# Patient Record
Sex: Female | Born: 2000 | Race: Black or African American | Hispanic: No | Marital: Single | Smoking: Never smoker
Health system: Southern US, Community
[De-identification: ages and names within clinical notes are randomized; demographics above are authoritative.]

## PROBLEM LIST (undated history)

## (undated) ENCOUNTER — Inpatient Hospital Stay (HOSPITAL_COMMUNITY): Payer: Medicaid Other | Admitting: Obstetrics & Gynecology

## (undated) DIAGNOSIS — Z789 Other specified health status: Secondary | ICD-10-CM

## (undated) HISTORY — PX: FINGER SURGERY: SHX640

## (undated) HISTORY — PX: DILATION AND CURETTAGE OF UTERUS: SHX78

---
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
## 2019-02-13 ENCOUNTER — Other Ambulatory Visit: Payer: Self-pay

## 2019-02-13 ENCOUNTER — Encounter (HOSPITAL_BASED_OUTPATIENT_CLINIC_OR_DEPARTMENT_OTHER): Payer: Self-pay | Admitting: *Deleted

## 2019-02-13 ENCOUNTER — Emergency Department (HOSPITAL_BASED_OUTPATIENT_CLINIC_OR_DEPARTMENT_OTHER)
Admission: EM | Admit: 2019-02-13 | Discharge: 2019-02-13 | Disposition: A | Discharge status: Home / Self Care | Payer: Medicaid Other | Attending: Emergency Medicine | Admitting: Emergency Medicine

## 2019-02-13 DIAGNOSIS — J029 Acute pharyngitis, unspecified: Secondary | ICD-10-CM | POA: Diagnosis present

## 2019-02-13 DIAGNOSIS — Z79899 Other long term (current) drug therapy: Secondary | ICD-10-CM | POA: Insufficient documentation

## 2019-02-13 LAB — PREGNANCY, URINE: Preg Test, Ur: NEGATIVE

## 2019-02-13 LAB — GROUP A STREP BY PCR: Group A Strep by PCR: NOT DETECTED

## 2019-02-13 MED ORDER — DEXAMETHASONE 4 MG PO TABS
10.0000 mg | ORAL_TABLET | Freq: Once | ORAL | Status: AC
  Administered 2019-02-13: 10 mg via ORAL
  Filled 2019-02-13: qty 1

## 2019-02-13 MED ORDER — ACETAMINOPHEN 500 MG PO TABS
1000.0000 mg | ORAL_TABLET | Freq: Once | ORAL | Status: AC
  Administered 2019-02-13: 1000 mg via ORAL
  Filled 2019-02-13: qty 2

## 2019-02-13 NOTE — ED Triage Notes (Signed)
C/o sore throat light headed, lower back pain onset last pm  Denies ua sx and no frever

## 2019-02-13 NOTE — ED Provider Notes (Signed)
[facility] EMERGENCY DEPARTMENT Provider Note   CSN: [id number] Arrival date & time: 02/13/19  5361    History   Chief Complaint Chief Complaint  Patient presents with  . Sore Throat    HPI Hannah Rose is a 18 y.o. female.     The history is provided by the patient.  Sore Throat This is a new problem. The current episode started yesterday. The problem occurs constantly. The problem has not changed since onset.Pertinent negatives include no chest pain, no abdominal pain, no headaches and no shortness of breath. Associated symptoms comments: Hx of strep. Nothing aggravates the symptoms. Nothing relieves the symptoms. She has tried nothing for the symptoms. The treatment provided no relief.    History reviewed. No pertinent past medical history.  There are no active problems to display for this patient.   Past Surgical History:  Procedure Laterality Date  . DILATION AND CURETTAGE OF UTERUS    . FINGER SURGERY       OB History    Gravida  1   Para      Term      Preterm      AB      Living        SAB      TAB      Ectopic      Multiple      Live Births               Home Medications    Prior to Admission medications   Medication Sig Start Date End Date Taking? Authorizing Provider  Prenatal Vit-Fe Fumarate-FA (PRENATAL COMPLETE) 14-0.4 MG TABS Take 1 capsule by mouth daily. 12/06/16   Mackuen, Fredia Sorrow, MD    Family History No family history on file.  Social History Social History   Tobacco Use  . Smoking status: Never Smoker  . Smokeless tobacco: Never Used  Substance Use Topics  . Alcohol use: No  . Drug use: No     Allergies   Patient has no known allergies.   Review of Systems Review of Systems  Constitutional: Negative for chills and fever.  HENT: Positive for sore throat. Negative for congestion, dental problem, drooling, ear discharge, ear pain, facial swelling, hearing loss, mouth sores,  nosebleeds, postnasal drip, tinnitus, trouble swallowing and voice change.   Eyes: Negative for pain and visual disturbance.  Respiratory: Negative for cough and shortness of breath.   Cardiovascular: Negative for chest pain and palpitations.  Gastrointestinal: Negative for abdominal pain and vomiting.  Genitourinary: Negative for dysuria and hematuria.  Musculoskeletal: Negative for arthralgias and back pain.  Skin: Negative for color change and rash.  Neurological: Negative for seizures, syncope and headaches.  All other systems reviewed and are negative.    Physical Exam Updated Vital Signs BP 103/62 (BP Location: Right Arm)   Pulse 64   Temp 98.4 F (36.9 C) (Oral)   Resp 14   Ht 5\' 3"  (1.6 m)   Wt 103.5 kg   LMP 02/01/2019 (Approximate)   SpO2 100%   Breastfeeding Unknown   BMI 40.42 kg/m   Physical Exam Vitals signs and nursing note reviewed.  Constitutional:      General: She is not in acute distress.    Appearance: She is well-developed. She is not ill-appearing.  HENT:     Head: Normocephalic and atraumatic.     Right Ear: Tympanic membrane normal.     Left Ear: Tympanic membrane normal.  Mouth/Throat:     Mouth: Mucous membranes are moist. No oral lesions.     Pharynx: Uvula midline. Posterior oropharyngeal erythema present. No pharyngeal swelling, oropharyngeal exudate or uvula swelling.     Tonsils: No tonsillar exudate or tonsillar abscesses.  Eyes:     Conjunctiva/sclera: Conjunctivae normal.  Neck:     Musculoskeletal: Normal range of motion and neck supple.  Cardiovascular:     Rate and Rhythm: Normal rate and regular rhythm.     Heart sounds: No murmur.  Pulmonary:     Effort: Pulmonary effort is normal. No respiratory distress.     Breath sounds: Normal breath sounds.  Abdominal:     Palpations: Abdomen is soft.     Tenderness: There is no abdominal tenderness.  Skin:    General: Skin is warm and dry.  Neurological:     Mental Status: She  is alert.      ED Treatments / Results  Labs (all labs ordered are listed, but only abnormal results are displayed) Labs Reviewed  GROUP A STREP BY PCR  PREGNANCY, URINE    EKG None  Radiology No results found.  Procedures Procedures (including critical care time)  Medications Ordered in ED Medications  acetaminophen (TYLENOL) tablet 1,000 mg (has no administration in time range)  dexamethasone (DECADRON) tablet 10 mg (10 mg Oral Given 02/13/19 0936)     Initial Impression / Assessment and Plan / ED Course  I have reviewed the triage vital signs and the nursing notes.  Pertinent labs & imaging results that were available during my care of the patient were reviewed by me and considered in my medical decision making (see chart for details).     Hannah Rose is a 18 year old female with no significant medical history presents to the ED with sore throat.  Patient with normal vitals.  No fever.  Pregnancy test negative.  Patient with some redness in the back of her throat but no obvious exudates, swelling.  No concern for peritonsillar abscess or retropharyngeal abscess.  Patient does not have any trismus, no drooling, no signs to suggest any deep space infection.  Strep test was performed and was negative.  Will give Decadron for symptomatic relief.  Discharged from ED in good condition.  Given return precautions.  This chart was dictated using voice recognition software.  Despite best efforts to proofread,  errors can occur which can change the documentation meaning.    Final Clinical Impressions(s) / ED Diagnoses   Final diagnoses:  Sore throat    ED Discharge Orders    None       Virgina NorfolkCuratolo, Nyajah Hyson, DO 02/13/19 [id number]

## 2019-11-11 ENCOUNTER — Encounter (HOSPITAL_BASED_OUTPATIENT_CLINIC_OR_DEPARTMENT_OTHER): Payer: Self-pay | Admitting: *Deleted

## 2019-11-11 ENCOUNTER — Emergency Department (HOSPITAL_BASED_OUTPATIENT_CLINIC_OR_DEPARTMENT_OTHER): Payer: Medicaid Other

## 2019-11-11 ENCOUNTER — Other Ambulatory Visit: Payer: Self-pay

## 2019-11-11 DIAGNOSIS — Y998 Other external cause status: Secondary | ICD-10-CM | POA: Diagnosis not present

## 2019-11-11 DIAGNOSIS — Y929 Unspecified place or not applicable: Secondary | ICD-10-CM | POA: Insufficient documentation

## 2019-11-11 DIAGNOSIS — Z5321 Procedure and treatment not carried out due to patient leaving prior to being seen by health care provider: Secondary | ICD-10-CM | POA: Insufficient documentation

## 2019-11-11 DIAGNOSIS — Y9389 Activity, other specified: Secondary | ICD-10-CM | POA: Insufficient documentation

## 2019-11-11 DIAGNOSIS — M546 Pain in thoracic spine: Secondary | ICD-10-CM | POA: Diagnosis present

## 2019-11-11 LAB — URINALYSIS, ROUTINE W REFLEX MICROSCOPIC
Bilirubin Urine: NEGATIVE
Glucose, UA: NEGATIVE mg/dL
Hgb urine dipstick: NEGATIVE
Ketones, ur: NEGATIVE mg/dL
Leukocytes,Ua: NEGATIVE
Nitrite: NEGATIVE
Protein, ur: NEGATIVE mg/dL
Specific Gravity, Urine: 1.025 (ref 1.005–1.030)
pH: 6.5 (ref 5.0–8.0)

## 2019-11-11 LAB — PREGNANCY, URINE: Preg Test, Ur: NEGATIVE
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
## 2019-11-12 ENCOUNTER — Emergency Department (HOSPITAL_BASED_OUTPATIENT_CLINIC_OR_DEPARTMENT_OTHER)
Admission: EM | Admit: 2019-11-12 | Discharge: 2019-11-12 | Disposition: A | Discharge status: Home / Self Care | Payer: Medicaid Other | Attending: Emergency Medicine | Admitting: Emergency Medicine

## 2020-01-04 ENCOUNTER — Emergency Department (HOSPITAL_BASED_OUTPATIENT_CLINIC_OR_DEPARTMENT_OTHER): Payer: Medicaid Other

## 2020-01-04 ENCOUNTER — Encounter (HOSPITAL_BASED_OUTPATIENT_CLINIC_OR_DEPARTMENT_OTHER): Payer: Self-pay | Admitting: Emergency Medicine

## 2020-01-04 ENCOUNTER — Other Ambulatory Visit: Payer: Self-pay

## 2020-01-04 ENCOUNTER — Emergency Department (HOSPITAL_BASED_OUTPATIENT_CLINIC_OR_DEPARTMENT_OTHER)
Admission: EM | Admit: 2020-01-04 | Discharge: 2020-01-04 | Disposition: A | Discharge status: Home / Self Care | Payer: Medicaid Other | Attending: Emergency Medicine | Admitting: Emergency Medicine

## 2020-01-04 DIAGNOSIS — M545 Low back pain, unspecified: Secondary | ICD-10-CM

## 2020-01-04 DIAGNOSIS — M546 Pain in thoracic spine: Secondary | ICD-10-CM | POA: Insufficient documentation

## 2020-01-04 LAB — PREGNANCY, URINE: Preg Test, Ur: NEGATIVE

## 2020-01-04 MED ORDER — MELOXICAM 7.5 MG PO TABS: 7.5000 mg | ORAL_TABLET | Freq: Every day | ORAL | 0 refills | Status: AC

## 2020-01-04 MED ORDER — CYCLOBENZAPRINE HCL 10 MG PO TABS: 10.0000 mg | ORAL_TABLET | Freq: Two times a day (BID) | ORAL | 0 refills | Status: DC | PRN
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
## 2020-01-04 NOTE — ED Notes (Signed)
AVS reviewed with patient, informed of medication that were sent electronically. Opportunity for questions provided
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
## 2020-01-30 ENCOUNTER — Emergency Department (HOSPITAL_BASED_OUTPATIENT_CLINIC_OR_DEPARTMENT_OTHER)
Admission: EM | Admit: 2020-01-30 | Discharge: 2020-01-30 | Discharge status: Against Medical Advice | Payer: Medicaid Other

## 2020-01-31 ENCOUNTER — Emergency Department (HOSPITAL_BASED_OUTPATIENT_CLINIC_OR_DEPARTMENT_OTHER): Payer: Medicaid Other

## 2020-01-31 ENCOUNTER — Emergency Department (HOSPITAL_BASED_OUTPATIENT_CLINIC_OR_DEPARTMENT_OTHER)
Admission: EM | Admit: 2020-01-31 | Discharge: 2020-01-31 | Disposition: A | Discharge status: Home / Self Care | Payer: Medicaid Other | Attending: Emergency Medicine | Admitting: Emergency Medicine

## 2020-01-31 ENCOUNTER — Encounter (HOSPITAL_BASED_OUTPATIENT_CLINIC_OR_DEPARTMENT_OTHER): Payer: Self-pay | Admitting: Emergency Medicine

## 2020-01-31 ENCOUNTER — Other Ambulatory Visit: Payer: Self-pay

## 2020-01-31 DIAGNOSIS — S60112A Contusion of left thumb with damage to nail, initial encounter: Secondary | ICD-10-CM | POA: Diagnosis not present

## 2020-01-31 DIAGNOSIS — Y92810 Car as the place of occurrence of the external cause: Secondary | ICD-10-CM | POA: Insufficient documentation

## 2020-01-31 DIAGNOSIS — S6702XA Crushing injury of left thumb, initial encounter: Secondary | ICD-10-CM | POA: Diagnosis present

## 2020-01-31 DIAGNOSIS — Y999 Unspecified external cause status: Secondary | ICD-10-CM | POA: Insufficient documentation

## 2020-01-31 DIAGNOSIS — W230XXA Caught, crushed, jammed, or pinched between moving objects, initial encounter: Secondary | ICD-10-CM | POA: Diagnosis not present

## 2020-01-31 DIAGNOSIS — Z23 Encounter for immunization: Secondary | ICD-10-CM | POA: Diagnosis not present

## 2020-01-31 DIAGNOSIS — Y9389 Activity, other specified: Secondary | ICD-10-CM | POA: Insufficient documentation

## 2020-01-31 DIAGNOSIS — S6010XA Contusion of unspecified finger with damage to nail, initial encounter: Secondary | ICD-10-CM

## 2020-01-31 DIAGNOSIS — S6710XA Crushing injury of unspecified finger(s), initial encounter: Secondary | ICD-10-CM

## 2020-01-31 MED ORDER — TETANUS-DIPHTH-ACELL PERTUSSIS 5-2.5-18.5 LF-MCG/0.5 IM SUSP
0.5000 mL | Freq: Once | INTRAMUSCULAR | Status: AC
  Administered 2020-01-31: 0.5 mL via INTRAMUSCULAR
  Filled 2020-01-31: qty 0.5

## 2020-01-31 NOTE — Discharge Instructions (Signed)
Your history and exams are consistent with a crush injury to your left thumb without any underlying fracture.  As you had a growing hematoma under your nail called a subungual hematoma, we felt you would best get relief with trephination or opening the hole on the top of the nail.  This significantly relieved your discomfort and you are able to bend your finger better.  We updated your tetanus vaccination.  Please watch for signs and symptoms of infection however I suspect it will continue to heal.  You may end up losing the nail based on the injury but it may stay intact.  Please follow-up with your primary doctor.  If any symptoms change or worsen or starts to look infected over the next week or so, please return to the nearest emergency department.

## 2020-01-31 NOTE — ED Triage Notes (Signed)
Pt shut left thumb in car door yesterday. Pt has bruising to thumb and under nail

## 2020-01-31 NOTE — ED Provider Notes (Signed)
[facility] EMERGENCY DEPARTMENT Provider Note   CSN: [id number] Arrival date & time: 01/31/20  3151     History Chief Complaint  Patient presents with  . Finger Injury    Hannah Rose is a 19 y.o. female.  The history is provided by the patient and medical records. No language interpreter was used.  Hand Pain This is a new problem. The current episode started yesterday. The problem occurs constantly. The problem has been gradually worsening. Pertinent negatives include no chest pain, no abdominal pain, no headaches and no shortness of breath. The symptoms are aggravated by bending. Nothing relieves the symptoms. She has tried nothing for the symptoms. The treatment provided no relief.       History reviewed. No pertinent past medical history.  There are no problems to display for this patient.   Past Surgical History:  Procedure Laterality Date  . DILATION AND CURETTAGE OF UTERUS    . FINGER SURGERY       OB History    Gravida  1   Para      Term      Preterm      AB      Living        SAB      TAB      Ectopic      Multiple      Live Births              No family history on file.  Social History   Tobacco Use  . Smoking status: Never Smoker  . Smokeless tobacco: Never Used  Vaping Use  . Vaping Use: Never used  Substance Use Topics  . Alcohol use: No  . Drug use: No    Home Medications Prior to Admission medications   Not on File    Allergies    Patient has no known allergies.  Review of Systems   Review of Systems  Constitutional: Negative for chills, fatigue and fever.  HENT: Negative for congestion.   Respiratory: Negative for chest tightness and shortness of breath.   Cardiovascular: Negative for chest pain.  Gastrointestinal: Negative for abdominal pain.  Musculoskeletal: Negative for back pain.  Skin: Positive for color change (subungal hematoma).  Neurological: Negative for headaches.    Psychiatric/Behavioral: Negative for agitation.  All other systems reviewed and are negative.   Physical Exam Updated Vital Signs BP (!) 129/75 (BP Location: Right Arm)   Pulse 63   Temp 97.9 F (36.6 C) (Oral)   Resp 16   Ht 5\' 3"  (1.6 m)   Wt (!) 97.5 kg   SpO2 100%   BMI 38.09 kg/m   Physical Exam Vitals and nursing note reviewed.  Constitutional:      General: She is not in acute distress.    Appearance: She is not ill-appearing, toxic-appearing or diaphoretic.  HENT:     Head: Normocephalic.  Eyes:     Conjunctiva/sclera: Conjunctivae normal.  Cardiovascular:     Rate and Rhythm: Normal rate.     Pulses: Normal pulses.  Chest:     Chest wall: No tenderness.  Abdominal:     Tenderness: There is no abdominal tenderness.  Musculoskeletal:        General: Tenderness and signs of injury present.     Left hand: Tenderness present. Normal capillary refill. Normal pulse.       Hands:  Skin:    Capillary Refill: Capillary refill takes less than 2 seconds.  Findings: Bruising present.  Neurological:     Mental Status: She is alert and oriented to person, place, and time.     Sensory: No sensory deficit.     Motor: No weakness.  Psychiatric:        Mood and Affect: Mood normal.     ED Results / Procedures / Treatments   Labs (all labs ordered are listed, but only abnormal results are displayed) Labs Reviewed - No data to display  EKG None  Radiology DG Finger Thumb Left  Result Date: 01/31/2020 CLINICAL DATA:  Left thumb injury. EXAM: LEFT THUMB 2+V COMPARISON:  No recent prior. FINDINGS: No acute bony or joint abnormality identified. No evidence of fracture dislocation. No radiopaque foreign body. IMPRESSION: No acute bony abnormality.  No radiopaque foreign body. Electronically Signed   By: Maisie Fus  Register   On: 01/31/2020 07:16    Procedures .Nail Removal  Date/Time: 01/31/2020 9:40 AM Performed by: Heide Scales, MD Authorized by:  Heide Scales, MD   Consent:    Consent obtained:  Verbal   Consent given by:  Patient   Risks discussed:  Bleeding, pain, infection and permanent nail deformity   Alternatives discussed:  No treatment Location:    Hand:  L thumb Pre-procedure details:    Skin preparation:  ChloraPrep   Preparation: Patient was prepped and draped in the usual sterile fashion   Anesthesia (see MAR for exact dosages):    Anesthesia method:  None Trephination:    Subungual hematoma drained: yes     Trephination instrument:  Cautery and needle Post-procedure details:    Dressing:  4x4 sterile gauze   Patient tolerance of procedure:  Tolerated well, no immediate complications   (including critical care time)  Medications Ordered in ED Medications  Tdap (BOOSTRIX) injection 0.5 mL (has no administration in time range)    ED Course  I have reviewed the triage vital signs and the nursing notes.  Pertinent labs & imaging results that were available during my care of the patient were reviewed by me and considered in my medical decision making (see chart for details).    MDM Rules/Calculators/A&P                          Hannah Rose is a 19 y.o. female with no significant past medical history who is right-handed who presents with left hand injury.  She reports that yesterday afternoon around 3:30 PM, she crushed her left thumb in a car door while closing it.  She reports immediate onset of pain.  She denies any laceration.  She reports that overnight, she developed a bruising and hematoma under the nail of her left thumb and wanted to have it evaluated as the pain was worsening.  She denies numbness, tingling, or weakness.  She denies other injuries.  She denies fevers, chills congestion, or cough.  She does not think her tetanus is up-to-date.  She denies any other pains including no wrist pain, shoulder pain, or elbow pain.  On exam, patient does have a partial subungual hematoma on the  left thumb.  She does have good cap refill, sensation, and pulse.  She had pain with bending of the DIP joint on the thumb.  She had no snuffbox tenderness.  Good grip strength.  Good pulses.  No other injuries on exam.  Lungs clear and chest nontender.  Abdomen nontender.  Had a shared decision-making conversation with patient and we agreed  she tried nail trephination.  X-ray was obtained and there was no evidence of fracture or dislocation.  Trephination took place without difficulty and the hematoma was drained.  Patient had significant pain relief and was able to bend her thumb normally.  She is feeling better.  Tetanus will be updated.  Patient will follow up with her PCP and we discussed management of the nail going forward as she may still lose the nail.  Patient agrees to watch for signs and symptoms of infection developing.  Do not feel she is high risk for infection at this time with lack of other medical problems.  Patient agrees with plan of care and follow-up instructions.  Patient discharged in good condition after significant relief after needle trephination.    Final Clinical Impression(s) / ED Diagnoses Final diagnoses:  Subungual hematoma of digit of hand, initial encounter  Crushing injury of finger, initial encounter    Rx / DC Orders ED Discharge Orders    None     Clinical Impression: 1. Subungual hematoma of digit of hand, initial encounter   2. Crushing injury of finger, initial encounter     Disposition: Discharge  Condition: Good  I have discussed the results, Dx and Tx plan with the pt(& family if present). He/she/they expressed understanding and agree(s) with the plan. Discharge instructions discussed at great length. Strict return precautions discussed and pt &/or family have verbalized understanding of the instructions. No further questions at time of discharge.    New Prescriptions   No medications on file    Follow Up: Alm Bustard, MD [address] [place] [address] [phone]     [facility]MENT [address] [id number] [facility] [place] [address] [phone]       Franz Svec, Canary Brim, MD 01/31/20 [phone]

## 2020-05-31 ENCOUNTER — Other Ambulatory Visit: Payer: Self-pay

## 2020-05-31 ENCOUNTER — Emergency Department (HOSPITAL_BASED_OUTPATIENT_CLINIC_OR_DEPARTMENT_OTHER)
Admission: EM | Admit: 2020-05-31 | Discharge: 2020-05-31 | Disposition: A | Discharge status: Home / Self Care | Payer: Medicaid Other | Attending: Emergency Medicine | Admitting: Emergency Medicine

## 2020-05-31 ENCOUNTER — Encounter (HOSPITAL_BASED_OUTPATIENT_CLINIC_OR_DEPARTMENT_OTHER): Payer: Self-pay | Admitting: Emergency Medicine

## 2020-05-31 DIAGNOSIS — M26622 Arthralgia of left temporomandibular joint: Secondary | ICD-10-CM | POA: Insufficient documentation

## 2020-05-31 DIAGNOSIS — O99891 Other specified diseases and conditions complicating pregnancy: Secondary | ICD-10-CM | POA: Insufficient documentation

## 2020-05-31 DIAGNOSIS — Z3A09 9 weeks gestation of pregnancy: Secondary | ICD-10-CM | POA: Insufficient documentation

## 2020-05-31 DIAGNOSIS — H6692 Otitis media, unspecified, left ear: Secondary | ICD-10-CM | POA: Diagnosis not present

## 2020-05-31 DIAGNOSIS — O219 Vomiting of pregnancy, unspecified: Secondary | ICD-10-CM | POA: Diagnosis not present

## 2020-05-31 DIAGNOSIS — H6690 Otitis media, unspecified, unspecified ear: Secondary | ICD-10-CM

## 2020-05-31 MED ORDER — AMOXICILLIN 500 MG PO CAPS
500.0000 mg | ORAL_CAPSULE | Freq: Three times a day (TID) | ORAL | 0 refills | Status: AC
Start: 2020-05-31 — End: 2020-06-07

## 2020-05-31 MED ORDER — DOXYLAMINE-PYRIDOXINE 10-10 MG PO TBEC: 1.0000 | DELAYED_RELEASE_TABLET | Freq: Two times a day (BID) | ORAL | 0 refills | Status: AC | PRN

## 2020-05-31 NOTE — ED Provider Notes (Signed)
[facility] Emergency Department Provider Note MRN:  [id number]  Arrival date & time: 05/31/20     Chief Complaint   Otalgia   History of Present Illness   Hannah Rose is a 19 y.o. year-old female with no pertinent past medical history presenting to the ED with chief complaint of otalgia.  3 separate complaints this evening.  Left ear pain for the past 3 days.  Also with left-sided jaw pain worse when trying to eat.  Also with fairly frequent nausea and vomiting over the past several weeks.  Patient is about [redacted] weeks pregnant.  She denies fever, no headache, no vision change, no neck pain, no chest pain or shortness of breath, no abdominal pain, no vaginal bleeding or discharge.  Symptoms are intermittent, mild to moderate in severity.  Review of Systems  A complete 10 system review of systems was obtained and all systems are negative except as noted in the HPI and PMH.   Patient's Health History   History reviewed. No pertinent past medical history.  Past Surgical History:  Procedure Laterality Date  . DILATION AND CURETTAGE OF UTERUS    . FINGER SURGERY      History reviewed. No pertinent family history.  Social History   Socioeconomic History  . Marital status: Single    Spouse name: Not on file  . Number of children: Not on file  . Years of education: Not on file  . Highest education level: Not on file  Occupational History  . Not on file  Tobacco Use  . Smoking status: Never Smoker  . Smokeless tobacco: Never Used  Vaping Use  . Vaping Use: Never used  Substance and Sexual Activity  . Alcohol use: No  . Drug use: No  . Sexual activity: Yes    Birth control/protection: None  Other Topics Concern  . Not on file  Social History Narrative  . Not on file   Social Determinants of Health   Financial Resource Strain:   . Difficulty of Paying Living Expenses: Not on file  Food Insecurity:   . Worried About Programme researcher, broadcasting/film/video in  the Last Year: Not on file  . Ran Out of Food in the Last Year: Not on file  Transportation Needs:   . Lack of Transportation (Medical): Not on file  . Lack of Transportation (Non-Medical): Not on file  Physical Activity:   . Days of Exercise per Week: Not on file  . Minutes of Exercise per Session: Not on file  Stress:   . Feeling of Stress : Not on file  Social Connections:   . Frequency of Communication with Friends and Family: Not on file  . Frequency of Social Gatherings with Friends and Family: Not on file  . Attends Religious Services: Not on file  . Active Member of Clubs or Organizations: Not on file  . Attends Banker Meetings: Not on file  . Marital Status: Not on file  Intimate Partner Violence:   . Fear of Current or Ex-Partner: Not on file  . Emotionally Abused: Not on file  . Physically Abused: Not on file  . Sexually Abused: Not on file     Physical Exam   Vitals:   05/31/20 0021  BP: 131/85  Pulse: (!) 119  Resp: 20  Temp: 98.6 F (37 C)  SpO2: 100%    CONSTITUTIONAL: Well-appearing, NAD NEURO:  Alert and oriented x 3, no focal deficits EYES:  eyes equal and  reactive; erythematous left TM with effusion ENT/NECK:  no LAD, no JVD CARDIO: Regular rate, well-perfused, normal S1 and S2 PULM:  CTAB no wheezing or rhonchi GI/GU:  normal bowel sounds, non-distended, non-tender MSK/SPINE:  No gross deformities, no edema SKIN:  no rash, atraumatic PSYCH:  Appropriate speech and behavior  *Additional and/or pertinent findings included in MDM below  Diagnostic and Interventional Summary    EKG Interpretation  Date/Time:    Ventricular Rate:    PR Interval:    QRS Duration:   QT Interval:    QTC Calculation:   R Axis:     Text Interpretation:        Labs Reviewed - No data to display  No orders to display    Medications - No data to display   Procedures  /  Critical Care Procedures  ED Course and Medical Decision Making  I have  reviewed the triage vital signs, the nursing notes, and pertinent available records from the EMR.  Listed above are laboratory and imaging tests that I personally ordered, reviewed, and interpreted and then considered in my medical decision making (see below for details).  Patient's left TM appears infected, will treat for otitis media.  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  Her nausea vomiting seems consistent with first trimester pregnancy, will prescribe Diclegis.  Nontender abdomen, otherwise reassuring vital signs.  Was initially tachycardic in triage up to 119 but on my reassessment her heart rate is in the 80s.       Elmer Sow. Pilar Plate, MD [facility] Emergency Medicine [facility] [email or web address]  Final Clinical Impressions(s) / ED Diagnoses     ICD-10-CM   1. Acute otitis media, unspecified otitis media type  H66.90   2. Arthralgia of left temporomandibular joint  M26.622   3. Nausea and vomiting during pregnancy prior to [redacted] weeks gestation  O21.9     ED Discharge Orders         Ordered    amoxicillin (AMOXIL) 500 MG capsule  3 times daily        05/31/20 0131    Doxylamine-Pyridoxine 10-10 MG TBEC  2 times daily PRN        05/31/20 0131           Discharge Instructions Discussed with and Provided to Patient:     Discharge Instructions     You were evaluated in the Emergency Department and after careful evaluation, we did not find any emergent condition requiring admission or further testing in the hospital.  Your exam/testing today is overall reassuring.  Symptoms seem to be due to an ear infection.  Please take the amoxicillin antibiotic as directed.  You can use the Diclegis antinausea medication as needed.  We recommend Tylenol 1000 mg every 4-6 hours for your discomforts.  Please return to the Emergency Department if you experience any worsening of your condition.   Thank you for allowing [place] to be a part of  your care.      Sabas Sous, MD 05/31/20 [phone]

## 2020-05-31 NOTE — Discharge Instructions (Addendum)
You were evaluated in the Emergency Department and after careful evaluation, we did not find any emergent condition requiring admission or further testing in the hospital.  Your exam/testing today is overall reassuring.  Symptoms seem to be due to an ear infection.  Please take the amoxicillin antibiotic as directed.  You can use the Diclegis antinausea medication as needed.  We recommend Tylenol 1000 mg every 4-6 hours for your discomforts.  Please return to the Emergency Department if you experience any worsening of your condition.   Thank you for allowing [place] to be a part of your care.

## 2020-05-31 NOTE — ED Triage Notes (Signed)
Left ear pain and swelling onset yesterday. States hx of ear infections in same ear. Also reports upper back pain, [redacted] weeks pregnant

## 2020-06-17 ENCOUNTER — Emergency Department (HOSPITAL_BASED_OUTPATIENT_CLINIC_OR_DEPARTMENT_OTHER)
Admission: EM | Admit: 2020-06-17 | Discharge: 2020-06-17 | Disposition: A | Discharge status: Home / Self Care | Payer: Medicaid Other | Attending: Emergency Medicine | Admitting: Emergency Medicine

## 2020-06-17 ENCOUNTER — Encounter (HOSPITAL_BASED_OUTPATIENT_CLINIC_OR_DEPARTMENT_OTHER): Payer: Self-pay

## 2020-06-17 ENCOUNTER — Other Ambulatory Visit: Payer: Self-pay

## 2020-06-17 DIAGNOSIS — Z3A12 12 weeks gestation of pregnancy: Secondary | ICD-10-CM | POA: Diagnosis not present

## 2020-06-17 DIAGNOSIS — R438 Other disturbances of smell and taste: Secondary | ICD-10-CM | POA: Diagnosis present

## 2020-06-17 DIAGNOSIS — O98511 Other viral diseases complicating pregnancy, first trimester: Secondary | ICD-10-CM | POA: Insufficient documentation

## 2020-06-17 DIAGNOSIS — U071 COVID-19: Secondary | ICD-10-CM | POA: Insufficient documentation

## 2020-06-17 DIAGNOSIS — J069 Acute upper respiratory infection, unspecified: Secondary | ICD-10-CM

## 2020-06-17 LAB — RESP PANEL BY RT-PCR (FLU A&B, COVID) ARPGX2
Influenza A by PCR: NEGATIVE
Influenza B by PCR: NEGATIVE
SARS Coronavirus 2 by RT PCR: POSITIVE — AB

## 2020-06-17 NOTE — ED Triage Notes (Signed)
Pt c/o flu like sx x 1 week-loss of taste/smell yesterday-NAD-steady gait

## 2020-06-17 NOTE — ED Notes (Signed)
Patient did not notify staff prior to leaving. Was seen by provider

## 2020-06-17 NOTE — ED Provider Notes (Signed)
[facility] EMERGENCY DEPARTMENT Provider Note   CSN: [id number] Arrival date & time: 06/17/20  1249     History Chief Complaint  Patient presents with  . Cough    Hannah Rose is a 19 y.o. female.  Patient [redacted] weeks pregnant.  No abdominal pain, no vaginal bleeding or discharge.  Covid type symptoms with loss of taste and smell, cough but no respiratory problems otherwise.  The history is provided by the patient.  Cough Cough characteristics:  Non-productive Sputum characteristics:  Nondescript Severity:  Mild Onset quality:  Gradual Timing:  Intermittent Progression:  Waxing and waning Chronicity:  New Context: upper respiratory infection   Relieved by:  Nothing Worsened by:  Nothing Associated symptoms: no chest pain, no chills, no ear pain, no fever, no rash, no shortness of breath and no sore throat        History reviewed. No pertinent past medical history.  There are no problems to display for this patient.   Past Surgical History:  Procedure Laterality Date  . DILATION AND CURETTAGE OF UTERUS    . FINGER SURGERY       OB History    Gravida  2   Para      Term      Preterm      AB      Living        SAB      IAB      Ectopic      Multiple      Live Births              No family history on file.  Social History   Tobacco Use  . Smoking status: Never Smoker  . Smokeless tobacco: Never Used  Vaping Use  . Vaping Use: Never used  Substance Use Topics  . Alcohol use: No  . Drug use: No    Home Medications Prior to Admission medications   Medication Sig Start Date End Date Taking? Authorizing Provider  Doxylamine-Pyridoxine 10-10 MG TBEC Take 1 tablet by mouth 2 (two) times daily as needed. 05/31/20   Sabas Sous, MD    Allergies    Patient has no known allergies.  Review of Systems   Review of Systems  Constitutional: Negative for chills and fever.  HENT: Negative for ear pain and sore throat.    Eyes: Negative for pain and visual disturbance.  Respiratory: Positive for cough. Negative for shortness of breath.   Cardiovascular: Negative for chest pain and palpitations.  Gastrointestinal: Negative for abdominal pain and vomiting.  Genitourinary: Negative for dysuria and hematuria.  Musculoskeletal: Negative for arthralgias and back pain.  Skin: Negative for color change and rash.  Neurological: Negative for seizures and syncope.  All other systems reviewed and are negative.   Physical Exam Updated Vital Signs BP 123/78 (BP Location: Left Arm)   Pulse 100   Temp 98.8 F (37.1 C) (Oral)   Resp 20   LMP 12/27/2019 (Approximate)   SpO2 96%   Physical Exam Vitals and nursing note reviewed.  Constitutional:      General: She is not in acute distress.    Appearance: She is well-developed and well-nourished.  HENT:     Head: Normocephalic and atraumatic.     Nose: Congestion present.  Eyes:     Extraocular Movements: Extraocular movements intact.     Conjunctiva/sclera: Conjunctivae normal.     Pupils: Pupils are equal, round, and reactive to light.  Cardiovascular:  Rate and Rhythm: Normal rate.  Pulmonary:     Effort: Pulmonary effort is normal. No respiratory distress.  Musculoskeletal:        General: No edema.     Cervical back: Neck supple.  Neurological:     Mental Status: She is alert.  Psychiatric:        Mood and Affect: Mood and affect normal.     ED Results / Procedures / Treatments   Labs (all labs ordered are listed, but only abnormal results are displayed) Labs Reviewed  RESP PANEL BY RT-PCR (FLU A&B, COVID) ARPGX2 - Abnormal; Notable for the following components:      Result Value   SARS Coronavirus 2 by RT PCR POSITIVE (*)    All other components within normal limits    EKG None  Radiology No results found.  Procedures Procedures (including critical care time)  Medications Ordered in ED Medications - No data to display  ED  Course  I have reviewed the triage vital signs and the nursing notes.  Pertinent labs & imaging results that were available during my care of the patient were reviewed by me and considered in my medical decision making (see chart for details).    MDM Rules/Calculators/A&P                          Hannah Rose is a 19 year old female who presents to the ED with Covid type symptoms.  Loss of taste and smell.  Not vaccinated.  Patient [redacted] weeks pregnant but no abdominal pain, vaginal discharge, vaginal bleeding.  Overall URI symptoms similar to her child.  Could be Covid versus other viral process.  Swab for Covid and influenza.  Given return precautions but otherwise appeared well with no signs of respiratory distress.  Normal vitals.  No fever.  This chart was dictated using voice recognition software.  Despite best efforts to proofread,  errors can occur which can change the documentation meaning.   Hannah Rose was evaluated in Emergency Department on 06/17/2020 for the symptoms described in the history of present illness. She was evaluated in the context of the global COVID-19 pandemic, which necessitated consideration that the patient might be at risk for infection with the SARS-CoV-2 virus that causes COVID-19. Institutional protocols and algorithms that pertain to the evaluation of patients at risk for COVID-19 are in a state of rapid change based on information released by regulatory bodies including the CDC and federal and state organizations. These policies and algorithms were followed during the patient's care in the ED.    Final Clinical Impression(s) / ED Diagnoses Final diagnoses:  Upper respiratory tract infection, unspecified type    Rx / DC Orders ED Discharge Orders    None       Virgina Norfolk, DO 06/17/20 1422

## 2020-06-18 ENCOUNTER — Telehealth: Payer: Self-pay | Admitting: Nurse Practitioner

## 2020-06-18 DIAGNOSIS — U071 COVID-19: Secondary | ICD-10-CM

## 2020-06-18 NOTE — Telephone Encounter (Signed)
Called to Discuss with patient about Covid symptoms and the use of a monoclonal antibody infusion for those with mild to moderate Covid symptoms and at a high risk of hospitalization.     Pt is qualified for this infusion at the [place] infusion center due to co-morbid conditions and/or a member of an at-risk group.     Unable to reach pt. Left message to return call.   Quatisha Zylka, DNP, AGNP-C [phone] (Infusion Center Hotline)  

## 2020-07-18 ENCOUNTER — Emergency Department (HOSPITAL_BASED_OUTPATIENT_CLINIC_OR_DEPARTMENT_OTHER)
Admission: EM | Admit: 2020-07-18 | Discharge: 2020-07-19 | Disposition: A | Discharge status: Home / Self Care | Payer: Medicaid Other | Attending: Emergency Medicine | Admitting: Emergency Medicine

## 2020-07-18 ENCOUNTER — Encounter (HOSPITAL_BASED_OUTPATIENT_CLINIC_OR_DEPARTMENT_OTHER): Payer: Self-pay | Admitting: Emergency Medicine

## 2020-07-18 ENCOUNTER — Other Ambulatory Visit: Payer: Self-pay

## 2020-07-18 DIAGNOSIS — K226 Gastro-esophageal laceration-hemorrhage syndrome: Secondary | ICD-10-CM | POA: Diagnosis not present

## 2020-07-18 DIAGNOSIS — Z3A17 17 weeks gestation of pregnancy: Secondary | ICD-10-CM | POA: Insufficient documentation

## 2020-07-18 DIAGNOSIS — O219 Vomiting of pregnancy, unspecified: Secondary | ICD-10-CM | POA: Diagnosis not present

## 2020-07-18 DIAGNOSIS — O99612 Diseases of the digestive system complicating pregnancy, second trimester: Secondary | ICD-10-CM | POA: Diagnosis not present

## 2020-07-18 LAB — URINALYSIS, MICROSCOPIC (REFLEX): RBC / HPF: NONE SEEN RBC/hpf (ref 0–5)

## 2020-07-18 LAB — COMPREHENSIVE METABOLIC PANEL
ALT: 21 U/L (ref 0–44)
AST: 23 U/L (ref 15–41)
Albumin: 3.7 g/dL (ref 3.5–5.0)
Alkaline Phosphatase: 60 U/L (ref 38–126)
Anion gap: 11 (ref 5–15)
BUN: 7 mg/dL (ref 6–20)
CO2: 24 mmol/L (ref 22–32)
Calcium: 9.3 mg/dL (ref 8.9–10.3)
Chloride: 100 mmol/L (ref 98–111)
Creatinine, Ser: 0.72 mg/dL (ref 0.44–1.00)
GFR, Estimated: >60 mL/min (ref >60)
Glucose, Bld: 99 mg/dL (ref 70–99)
Potassium: 3.6 mmol/L (ref 3.5–5.1)
Sodium: 135 mmol/L (ref 135–145)
Total Bilirubin: 0.6 mg/dL (ref 0.3–1.2)
Total Protein: 6.9 g/dL (ref 6.5–8.1)

## 2020-07-18 LAB — CBC
HCT: 38.2 % (ref 36.0–46.0)
Hemoglobin: 13.5 g/dL (ref 12.0–15.0)
MCH: 29.6 pg (ref 26.0–34.0)
MCHC: 35.3 g/dL (ref 30.0–36.0)
MCV: 83.8 fL (ref 80.0–100.0)
Platelets: 295 10*3/uL (ref 150–400)
RBC: 4.56 MIL/uL (ref 3.87–5.11)
RDW: 12.5 % (ref 11.5–15.5)
WBC: 7.8 10*3/uL (ref 4.0–10.5)
nRBC: 0 % (ref 0.0–0.2)

## 2020-07-18 LAB — URINALYSIS, ROUTINE W REFLEX MICROSCOPIC
Bilirubin Urine: NEGATIVE
Glucose, UA: NEGATIVE mg/dL
Hgb urine dipstick: NEGATIVE
Ketones, ur: 40 mg/dL — AB
Nitrite: NEGATIVE
Protein, ur: NEGATIVE mg/dL
Specific Gravity, Urine: 1.02 (ref 1.005–1.030)
pH: 6 (ref 5.0–8.0)

## 2020-07-18 LAB — TROPONIN I (HIGH SENSITIVITY): Troponin I (High Sensitivity): <2 ng/L (ref <18)

## 2020-07-18 LAB — LIPASE, BLOOD: Lipase: 27 U/L (ref 11–51)

## 2020-07-18 MED ORDER — FAMOTIDINE IN NACL 20-0.9 MG/50ML-% IV SOLN
20.0000 mg | Freq: Once | INTRAVENOUS | Status: AC
  Administered 2020-07-18: 20 mg via INTRAVENOUS
  Filled 2020-07-18: qty 50

## 2020-07-18 MED ORDER — ONDANSETRON HCL 4 MG/2ML IJ SOLN
4.0000 mg | Freq: Once | INTRAMUSCULAR | Status: AC | PRN
  Administered 2020-07-18: 4 mg via INTRAVENOUS
  Filled 2020-07-18: qty 2

## 2020-07-18 MED ORDER — LACTATED RINGERS IV BOLUS
1000.0000 mL | Freq: Once | INTRAVENOUS | Status: AC
  Administered 2020-07-18: 1000 mL via INTRAVENOUS

## 2020-07-18 NOTE — ED Triage Notes (Addendum)
Reports having n/v all day today.  Also c/o epigastric pain since she started vomiting.  Reports bright red blood in vomit this afternoon.  Reports she passed out X 1.  Per mother was out 2 minutes.  Patient is [redacted] Weeks pregnant.

## 2020-07-18 NOTE — ED Notes (Signed)
Tech reported to RN that pt c/o chest pain currently. PA notified,  V/O obtained from PA for EKG.

## 2020-07-18 NOTE — ED Provider Notes (Signed)
[facility] Provider Note: Hannah Dell, MD, FACEP  CSN: [id number] MRN: [id number] ARRIVAL: 07/18/20 at 2109 ROOM: MH09/MH09   CHIEF COMPLAINT  Vomiting and Chest Pain   HISTORY OF PRESENT ILLNESS  07/18/20 11:02 PM Hannah Rose is a 20 y.o. female who is about [redacted] weeks pregnant.  She has had nausea and vomiting since yesterday evening.  The vomiting has been severe at times.  As the vomiting progressed it became blood-streaked.  She is also now having lower substernal chest pain which she rates as an 8 out of 10.  She describes it as burning, stabbing and sharp.  It is worse with vomiting.  Earlier, attempting to eat or drink caused her to vomit.  This vomiting is more severe than her usual morning sickness.  She has had no abdominal pain or cramping with this and has had no diarrhea.  She has not had a fever.  She was given Zofran IV prior to my evaluation with relief of her nausea.   History reviewed. No pertinent past medical history.  Past Surgical History:  Procedure Laterality Date  . DILATION AND CURETTAGE OF UTERUS    . FINGER SURGERY      No family history on file.  Social History   Tobacco Use  . Smoking status: Never Smoker  . Smokeless tobacco: Never Used  Vaping Use  . Vaping Use: Never used  Substance Use Topics  . Alcohol use: No  . Drug use: No    Prior to Admission medications   Medication Sig Start Date End Date Taking? Authorizing Provider  Doxylamine-Pyridoxine 10-10 MG TBEC Take 1 tablet by mouth 2 (two) times daily as needed. 05/31/20  Yes Sabas Sous, MD  famotidine (PEPCID) 40 MG tablet Take 1 tablet (40 mg total) by mouth 2 (two) times daily. 07/19/20  Yes Ruhee Enck, MD  metoCLOPramide (REGLAN) 10 MG tablet Take 1 tablet (10 mg total) by mouth every 6 (six) hours as needed for nausea or vomiting (nausea/headache). 07/19/20  Yes Avenell Sellers, MD    Allergies Patient has no known allergies.   REVIEW OF SYSTEMS   Negative except as noted here or in the History of Present Illness.   PHYSICAL EXAMINATION  Initial Vital Signs Blood pressure 117/68, pulse 64, temperature 98.5 F (36.9 C), temperature source Oral, resp. rate 12, height 5\' 3"  (1.6 m), weight 106.6 kg, last menstrual period 12/27/2019, SpO2 99 %, unknown if currently breastfeeding.  Examination General: Well-developed, well-nourished female in no acute distress; appearance consistent with age of record HENT: normocephalic; atraumatic Eyes: pupils equal, round and reactive to light; extraocular muscles intact Neck: supple Heart: regular rate and rhythm Lungs: clear to auscultation bilaterally Chest: Nontender Abdomen: soft; nondistended; nontender; bowel sounds present Extremities: No deformity; full range of motion; pulses normal Neurologic: Awake, alert and oriented; motor function intact in all extremities and symmetric; no facial droop Skin: Warm and dry Psychiatric: Normal mood and affect   RESULTS  Summary of this visit's results, reviewed and interpreted by myself:   EKG Interpretation  Date/Time:  Saturday July 18 2020 22:25:04 EST Ventricular Rate:  69 PR Interval:  132 QRS Duration: 84 QT Interval:  380 QTC Calculation: 408 R Axis:   77 Text Interpretation: Sinus rhythm Atrial premature complex Rate is slower Confirmed by Symiah Nowotny (05-30-2006) on 07/18/2020 11:02:24 PM      Laboratory Studies: Results for orders placed or performed during the hospital encounter of 07/18/20 (from the past 24  hour(s))  Lipase, blood     Status: None   Collection Time: 07/18/20  9:27 PM  Result Value Ref Range   Lipase 27 11 - 51 U/L  Comprehensive metabolic panel     Status: None   Collection Time: 07/18/20  9:27 PM  Result Value Ref Range   Sodium 135 135 - 145 mmol/L   Potassium 3.6 3.5 - 5.1 mmol/L   Chloride 100 98 - 111 mmol/L   CO2 24 22 - 32 mmol/L   Glucose, Bld 99 70 - 99 mg/dL   BUN 7 6 - 20 mg/dL    Creatinine, Ser 4.85 0.44 - 1.00 mg/dL   Calcium 9.3 8.9 - 46.2 mg/dL   Total Protein 6.9 6.5 - 8.1 g/dL   Albumin 3.7 3.5 - 5.0 g/dL   AST 23 15 - 41 U/L   ALT 21 0 - 44 U/L   Alkaline Phosphatase 60 38 - 126 U/L   Total Bilirubin 0.6 0.3 - 1.2 mg/dL   GFR, Estimated >70 >35 mL/min   Anion gap 11 5 - 15  CBC     Status: None   Collection Time: 07/18/20  9:27 PM  Result Value Ref Range   WBC 7.8 4.0 - 10.5 K/uL   RBC 4.56 3.87 - 5.11 MIL/uL   Hemoglobin 13.5 12.0 - 15.0 g/dL   HCT 00.9 38.1 - 82.9 %   MCV 83.8 80.0 - 100.0 fL   MCH 29.6 26.0 - 34.0 pg   MCHC 35.3 30.0 - 36.0 g/dL   RDW 93.7 16.9 - 67.8 %   Platelets 295 150 - 400 K/uL   nRBC 0.0 0.0 - 0.2 %  Urinalysis, Routine w reflex microscopic Urine, Clean Catch     Status: Abnormal   Collection Time: 07/18/20  9:27 PM  Result Value Ref Range   Color, Urine YELLOW YELLOW   APPearance HAZY (A) CLEAR   Specific Gravity, Urine 1.020 1.005 - 1.030   pH 6.0 5.0 - 8.0   Glucose, UA NEGATIVE NEGATIVE mg/dL   Hgb urine dipstick NEGATIVE NEGATIVE   Bilirubin Urine NEGATIVE NEGATIVE   Ketones, ur 40 (A) NEGATIVE mg/dL   Protein, ur NEGATIVE NEGATIVE mg/dL   Nitrite NEGATIVE NEGATIVE   Leukocytes,Ua SMALL (A) NEGATIVE  Troponin I (High Sensitivity)     Status: None   Collection Time: 07/18/20  9:27 PM  Result Value Ref Range   Troponin I (High Sensitivity) <2 <18 ng/L  Urinalysis, Microscopic (reflex)     Status: Abnormal   Collection Time: 07/18/20  9:27 PM  Result Value Ref Range   RBC / HPF NONE SEEN 0 - 5 RBC/hpf   WBC, UA 0-5 0 - 5 WBC/hpf   Bacteria, UA MANY (A) NONE SEEN   Squamous Epithelial / LPF 11-20 0 - 5   Imaging Studies: No results found.  ED COURSE and MDM  Nursing notes, initial and subsequent vitals signs, including pulse oximetry, reviewed and interpreted by myself.  Vitals:   07/18/20 2228 07/18/20 2230 07/18/20 2300 07/19/20 0000  BP: 117/68 117/68 115/62 115/62  Pulse: 64 71 65 69  Resp: 12  15 19  (!) 28  Temp:      TempSrc:      SpO2: 99% 99% 99% 100%  Weight:      Height:       Medications  ondansetron (ZOFRAN) injection 4 mg (4 mg Intravenous Given 07/18/20 2138)  lactated ringers bolus 1,000 mL (1,000 mLs Intravenous New Bag/Given  07/18/20 2345)  famotidine (PEPCID) IVPB 20 mg premix (20 mg Intravenous New Bag/Given 07/18/20 2345)   12:24 AM Patient now able to drink fluids without vomiting.  Patient given 20 mg Pepcid IV.  Her presentation is consistent with a Mallory-Weiss tear secondary to nausea and vomiting.  It is unclear if the vomiting is due to pregnancy or a viral gastrointestinal illness.   PROCEDURES  Procedures   ED DIAGNOSES     ICD-10-CM   1. Nausea and vomiting in pregnancy  O21.9   2. Mallory-Weiss tear  K22.6        Billyjoe Go, Jonny Ruiz, MD 07/19/20 [id number]

## 2020-07-19 MED ORDER — FAMOTIDINE 40 MG PO TABS
40.0000 mg | ORAL_TABLET | Freq: Two times a day (BID) | ORAL | 0 refills | Status: AC
Start: 2020-07-19 — End: ?

## 2020-07-19 MED ORDER — METOCLOPRAMIDE HCL 10 MG PO TABS
10.0000 mg | ORAL_TABLET | Freq: Four times a day (QID) | ORAL | 0 refills | Status: AC | PRN
Start: 2020-07-19 — End: ?

## 2020-07-19 NOTE — ED Notes (Signed)
Fluid bolus in progress.

## 2020-07-19 NOTE — ED Notes (Signed)
Pt drinking po fluids. Tolerating well.

## 2020-11-02 ENCOUNTER — Observation Stay (HOSPITAL_BASED_OUTPATIENT_CLINIC_OR_DEPARTMENT_OTHER)
Admission: EM | Admit: 2020-11-02 | Discharge: 2020-11-03 | Disposition: A | Discharge status: Home / Self Care | Payer: Medicaid Other | Attending: Emergency Medicine | Admitting: Emergency Medicine

## 2020-11-02 ENCOUNTER — Emergency Department (HOSPITAL_BASED_OUTPATIENT_CLINIC_OR_DEPARTMENT_OTHER): Payer: Medicaid Other

## 2020-11-02 ENCOUNTER — Other Ambulatory Visit: Payer: Self-pay

## 2020-11-02 ENCOUNTER — Encounter (HOSPITAL_BASED_OUTPATIENT_CLINIC_OR_DEPARTMENT_OTHER): Payer: Self-pay | Admitting: Emergency Medicine

## 2020-11-02 DIAGNOSIS — O26893 Other specified pregnancy related conditions, third trimester: Secondary | ICD-10-CM

## 2020-11-02 DIAGNOSIS — Y92009 Unspecified place in unspecified non-institutional (private) residence as the place of occurrence of the external cause: Secondary | ICD-10-CM | POA: Insufficient documentation

## 2020-11-02 DIAGNOSIS — Z20822 Contact with and (suspected) exposure to covid-19: Secondary | ICD-10-CM | POA: Diagnosis not present

## 2020-11-02 DIAGNOSIS — Z3A33 33 weeks gestation of pregnancy: Secondary | ICD-10-CM | POA: Diagnosis not present

## 2020-11-02 DIAGNOSIS — O9A213 Injury, poisoning and certain other consequences of external causes complicating pregnancy, third trimester: Secondary | ICD-10-CM | POA: Diagnosis not present

## 2020-11-02 DIAGNOSIS — W109XXA Fall (on) (from) unspecified stairs and steps, initial encounter: Secondary | ICD-10-CM | POA: Insufficient documentation

## 2020-11-02 DIAGNOSIS — S70312A Abrasion, left thigh, initial encounter: Secondary | ICD-10-CM | POA: Insufficient documentation

## 2020-11-02 DIAGNOSIS — S60812A Abrasion of left wrist, initial encounter: Secondary | ICD-10-CM | POA: Insufficient documentation

## 2020-11-02 DIAGNOSIS — W19XXXA Unspecified fall, initial encounter: Secondary | ICD-10-CM

## 2020-11-02 DIAGNOSIS — R109 Unspecified abdominal pain: Secondary | ICD-10-CM

## 2020-11-02 HISTORY — DX: Other specified health status: Z78.9

## 2020-11-02 LAB — COMPREHENSIVE METABOLIC PANEL
ALT: 13 U/L (ref 0–44)
AST: 19 U/L (ref 15–41)
Albumin: 2.4 g/dL — ABNORMAL LOW (ref 3.5–5.0)
Alkaline Phosphatase: 146 U/L — ABNORMAL HIGH (ref 38–126)
Anion gap: 10 (ref 5–15)
BUN: <5 mg/dL — ABNORMAL LOW (ref 6–20)
CO2: 22 mmol/L (ref 22–32)
Calcium: 8.6 mg/dL — ABNORMAL LOW (ref 8.9–10.3)
Chloride: 108 mmol/L (ref 98–111)
Creatinine, Ser: 0.61 mg/dL (ref 0.44–1.00)
GFR, Estimated: >60 mL/min (ref >60)
Glucose, Bld: 74 mg/dL (ref 70–99)
Potassium: 3.1 mmol/L — ABNORMAL LOW (ref 3.5–5.1)
Sodium: 140 mmol/L (ref 135–145)
Total Bilirubin: 0.7 mg/dL (ref 0.3–1.2)
Total Protein: 5.8 g/dL — ABNORMAL LOW (ref 6.5–8.1)

## 2020-11-02 LAB — URINALYSIS, ROUTINE W REFLEX MICROSCOPIC
Bilirubin Urine: NEGATIVE
Glucose, UA: NEGATIVE mg/dL
Hgb urine dipstick: NEGATIVE
Ketones, ur: 80 mg/dL — AB
Nitrite: NEGATIVE
Protein, ur: 30 mg/dL — AB
Specific Gravity, Urine: 1.029 (ref 1.005–1.030)
pH: 6 (ref 5.0–8.0)

## 2020-11-02 LAB — RESP PANEL BY RT-PCR (FLU A&B, COVID) ARPGX2
Influenza A by PCR: NEGATIVE
Influenza B by PCR: NEGATIVE
SARS Coronavirus 2 by RT PCR: NEGATIVE

## 2020-11-02 LAB — CBC
HCT: 34.4 % — ABNORMAL LOW (ref 36.0–46.0)
Hemoglobin: 11.3 g/dL — ABNORMAL LOW (ref 12.0–15.0)
MCH: 27.6 pg (ref 26.0–34.0)
MCHC: 32.8 g/dL (ref 30.0–36.0)
MCV: 84.1 fL (ref 80.0–100.0)
Platelets: 250 10*3/uL (ref 150–400)
RBC: 4.09 MIL/uL (ref 3.87–5.11)
RDW: 13.2 % (ref 11.5–15.5)
WBC: 11.3 10*3/uL — ABNORMAL HIGH (ref 4.0–10.5)
nRBC: 0 % (ref 0.0–0.2)

## 2020-11-02 LAB — WET PREP, GENITAL
Clue Cells Wet Prep HPF POC: NONE SEEN
Sperm: NONE SEEN
Trich, Wet Prep: NONE SEEN
Yeast Wet Prep HPF POC: NONE SEEN

## 2020-11-02 LAB — SAMPLE TO BLOOD BANK

## 2020-11-02 LAB — PROTEIN / CREATININE RATIO, URINE
Creatinine, Urine: 398.66 mg/dL
Protein Creatinine Ratio: 0.08 mg/mg{Cre} (ref 0.00–0.15)
Total Protein, Urine: 32 mg/dL

## 2020-11-02 LAB — TYPE AND SCREEN
ABO/RH(D): O POS
Antibody Screen: NEGATIVE

## 2020-11-02 LAB — AMNISURE RUPTURE OF MEMBRANE (ROM) NOT AT ARMC: Amnisure ROM: NEGATIVE

## 2020-11-02 MED ORDER — ACETAMINOPHEN 325 MG PO TABS
650.0000 mg | ORAL_TABLET | ORAL | Status: DC | PRN
  Administered 2020-11-02 – 2020-11-03 (×2): 650 mg via ORAL
  Filled 2020-11-02 (×2): qty 2

## 2020-11-02 MED ORDER — NIFEDIPINE 10 MG PO CAPS
10.0000 mg | ORAL_CAPSULE | Freq: Four times a day (QID) | ORAL | Status: DC
  Administered 2020-11-03 (×2): 10 mg via ORAL
  Filled 2020-11-02 (×2): qty 1

## 2020-11-02 MED ORDER — SODIUM CHLORIDE 0.9 % IV BOLUS
1000.0000 mL | Freq: Once | INTRAVENOUS | Status: AC
  Administered 2020-11-02: 1000 mL via INTRAVENOUS

## 2020-11-02 MED ORDER — DOCUSATE SODIUM 100 MG PO CAPS: 100.0000 mg | ORAL_CAPSULE | Freq: Every day | ORAL | Status: DC

## 2020-11-02 MED ORDER — CALCIUM CARBONATE ANTACID 500 MG PO CHEW: 2.0000 | CHEWABLE_TABLET | ORAL | Status: DC | PRN

## 2020-11-02 MED ORDER — NIFEDIPINE 10 MG PO CAPS
30.0000 mg | ORAL_CAPSULE | Freq: Once | ORAL | Status: AC
  Administered 2020-11-02: 30 mg via ORAL
  Filled 2020-11-02: qty 3

## 2020-11-02 MED ORDER — LACTATED RINGERS IV SOLN: INTRAVENOUS | Status: DC

## 2020-11-02 MED ORDER — ZOLPIDEM TARTRATE 5 MG PO TABS: 5.0000 mg | ORAL_TABLET | Freq: Every evening | ORAL | Status: DC | PRN

## 2020-11-02 MED ORDER — BETAMETHASONE SOD PHOS & ACET 6 (3-3) MG/ML IJ SUSP
12.0000 mg | INTRAMUSCULAR | Status: AC
  Administered 2020-11-02 – 2020-11-03 (×2): 12 mg via INTRAMUSCULAR
  Filled 2020-11-02: qty 5

## 2020-11-02 MED ORDER — PRENATAL MULTIVITAMIN CH
1.0000 | ORAL_TABLET | Freq: Every day | ORAL | Status: DC
  Administered 2020-11-03: 1 via ORAL
  Filled 2020-11-02: qty 1

## 2020-11-02 NOTE — ED Notes (Signed)
OB Rapid response just called RN Earlene Plater back with reports of Pt. Is beginning to contract.  Pt. Reports she has pain in her abd on the L side after a fall today.  Pt.has a cut/abrasion on her upper L thigh and on her L lower arm.  Pt. Is in no distress.

## 2020-11-02 NOTE — ED Triage Notes (Signed)
Patient presents with complaints of fall just prior to arrival; complains of left upper and lower extremity pain; wound noted to left forearm and left upper leg; no active bleeding noted. Complains of left side abd pain as well; states [redacted] weeks gestation; denies vaginal bleeding or leaking of fluid. Ambulatory to triage with steady gait.

## 2020-11-02 NOTE — ED Notes (Signed)
Family updated as to patient's status.  Boyfriend aware of Pt. Going to MAU and gave him instructions where to go.

## 2020-11-02 NOTE — ED Notes (Signed)
[facility] contacted for immediate transfer

## 2020-11-02 NOTE — Progress Notes (Signed)
ED RN at [facility] contacted [facility] regarding [redacted] wk pregnant patient with complaint of left side pain and mild abd pain following a fall down steps. Denies leaking of fluid or bleeding at this time. Pt. Admitted to [facility] and EFM attached per ED RN.    Dr. Charlotta Newton contacted at 1946 by RROB and aware of pt.

## 2020-11-02 NOTE — Progress Notes (Signed)
[facility] contacted ED RN regarding pt status due to uc's present via EFM. ED physician states patient does think she may have turned and hit her stomach on her fall.  IV fluid bolus initiated by ED.  Patient feeling uc's  Reactive fetal tracing present at this time.  Dr. Charlotta Newton called [facility] at 2019 and stated patient would be transferred to MAU at [facility] for further monitoring and assessment, ED RN notified Dr. Charlotta Newton of new onset leaking of fluid and increased abdominal pain.   MAU and AC notified by [facility].  [facility] initiated by [facility].

## 2020-11-02 NOTE — ED Provider Notes (Signed)
[facility] EMERGENCY DEPARTMENT Provider Note   CSN: [id number] Arrival date & time: 11/02/20  1905     History Chief Complaint  Patient presents with  . Fall    Hannah Rose is a 20 y.o. female.  Patient [redacted] weeks pregnant.  Had trip and fall just prior to arrival.  Hit the left side of her abdomen.  Abrasion to the left wrist and left thigh.  Ambulatory afterwards.  Denies any vaginal bleeding or loss of fluid.  Has felt baby move.  Does not think she is having contractions.  No complications with current pregnancy.  The history is provided by the patient.  Fall This is a new problem. The current episode started less than 1 hour ago. The problem has been resolved. Associated symptoms include abdominal pain. Pertinent negatives include no chest pain and no shortness of breath. Nothing aggravates the symptoms. Nothing relieves the symptoms. She has tried nothing for the symptoms. The treatment provided no relief.       History reviewed. No pertinent past medical history.  There are no problems to display for this patient.   Past Surgical History:  Procedure Laterality Date  . DILATION AND CURETTAGE OF UTERUS    . FINGER SURGERY       OB History    Gravida  2   Para      Term      Preterm      AB      Living        SAB      IAB      Ectopic      Multiple      Live Births              No family history on file.  Social History   Tobacco Use  . Smoking status: Never Smoker  . Smokeless tobacco: Never Used  Vaping Use  . Vaping Use: Never used  Substance Use Topics  . Alcohol use: No  . Drug use: No    Home Medications Prior to Admission medications   Medication Sig Start Date End Date Taking? Authorizing Provider  Doxylamine-Pyridoxine 10-10 MG TBEC Take 1 tablet by mouth 2 (two) times daily as needed. 05/31/20   Sabas Sous, MD  famotidine (PEPCID) 40 MG tablet Take 1 tablet (40 mg total) by mouth 2 (two) times daily.  07/19/20   Molpus, Jonny Ruiz, MD  metoCLOPramide (REGLAN) 10 MG tablet Take 1 tablet (10 mg total) by mouth every 6 (six) hours as needed for nausea or vomiting (nausea/headache). 07/19/20   Molpus, Jonny Ruiz, MD    Allergies    Patient has no known allergies.  Review of Systems   Review of Systems  Constitutional: Negative for chills and fever.  HENT: Negative for ear pain and sore throat.   Eyes: Negative for pain and visual disturbance.  Respiratory: Negative for cough and shortness of breath.   Cardiovascular: Negative for chest pain and palpitations.  Gastrointestinal: Positive for abdominal pain. Negative for vomiting.  Genitourinary: Negative for dysuria and hematuria.  Musculoskeletal: Positive for arthralgias. Negative for back pain.  Skin: Positive for wound. Negative for color change and rash.  Neurological: Negative for seizures and syncope.  All other systems reviewed and are negative.   Physical Exam Updated Vital Signs  ED Triage Vitals  Enc Vitals Group     BP 11/02/20 1921 127/82     Pulse Rate 11/02/20 1921 77     Resp 11/02/20  1921 18     Temp 11/02/20 1921 98.6 F (37 C)     Temp Source 11/02/20 1921 Oral     SpO2 11/02/20 1921 98 %     Weight 11/02/20 1915 235 lb 0.2 oz (106.6 kg)     Height 11/02/20 1915 5\' 3"  (1.6 m)     Head Circumference --      Peak Flow --      Pain Score 11/02/20 1915 8     Pain Loc --      Pain Edu? --      Excl. in GC? --     Physical Exam Vitals and nursing note reviewed.  Constitutional:      General: She is not in acute distress.    Appearance: She is well-developed. She is not ill-appearing.  HENT:     Head: Normocephalic and atraumatic.     Mouth/Throat:     Mouth: Mucous membranes are moist.  Eyes:     Extraocular Movements: Extraocular movements intact.     Conjunctiva/sclera: Conjunctivae normal.     Pupils: Pupils are equal, round, and reactive to light.  Cardiovascular:     Rate and Rhythm: Normal rate and regular  rhythm.     Pulses: Normal pulses.     Heart sounds: Normal heart sounds. No murmur heard.   Pulmonary:     Effort: Pulmonary effort is normal. No respiratory distress.     Breath sounds: Normal breath sounds.  Abdominal:     Palpations: Abdomen is soft.     Tenderness: There is abdominal tenderness.  Musculoskeletal:        General: Tenderness present. No swelling or deformity. Normal range of motion.     Cervical back: Normal range of motion and neck supple.     Comments: Tender to the left thigh and left wrist  Skin:    General: Skin is warm and dry.     Capillary Refill: Capillary refill takes less than 2 seconds.     Comments: Abrasion to left wrist and left thigh  Neurological:     General: No focal deficit present.     Mental Status: She is alert.     ED Results / Procedures / Treatments   Labs (all labs ordered are listed, but only abnormal results are displayed) Labs Reviewed  RESP PANEL BY RT-PCR (FLU A&B, COVID) [id number]    EKG None  Radiology DG Wrist Complete Left  Result Date: 11/02/2020 CLINICAL DATA:  Pain EXAM: LEFT WRIST - COMPLETE 3+ VIEW COMPARISON:  None. FINDINGS: There is no evidence of fracture or dislocation. There is no evidence of arthropathy or other focal bone abnormality. Soft tissues are unremarkable. IMPRESSION: Negative. Electronically Signed   By: 01/02/2021 M.D.   On: 11/02/2020 20:40   DG Knee 1-2 Views Left  Result Date: 11/02/2020 CLINICAL DATA:  01/02/2021 with knee pain. EXAM: LEFT KNEE - 1-2 VIEW COMPARISON:  None. FINDINGS: Only lateral view was ordered because the patient was pregnant. Normal frontal projection of the knee. No joint space narrowing. No traumatic finding. IMPRESSION: Normal one view frontal exam. Electronically Signed   By: Larey Seat M.D.   On: 11/02/2020 20:55    Procedures Procedures   Medications Ordered in ED Medications  sodium chloride 0.9 % bolus 1,000 mL (1,000 mLs Intravenous New Bag/Given 11/02/20 2019)     ED Course  I have reviewed the triage vital signs and the nursing notes.  Pertinent labs & imaging results  that were available during my care of the patient were reviewed by me and considered in my medical decision making (see chart for details).    MDM Rules/Calculators/A&P                          Carrell Palmatier is here after mechanical fall.  [redacted] weeks pregnant.  Tripped and fell hit the left side of her abdomen, left wrist, left thigh on a brick wall.  No loss of consciousness.  Fall happened just prior to arrival.  She does not think she is having any contractions.  She has not had any decreased fetal movement or loss of fluid or vaginal bleeding.  She has small abrasion to the left wrist and left thigh.  Tocometry was started and appears the patient was having contractions every 3 minutes.  Fetal heart rate was well within normal range in the 150s.  Fetus was having good decelerations.  No decelerations.  However about 20 minutes after patient arrived she did have small leakage of fluid.  At that time I talked with the [facility] on-call to make them aware of this.  Fluid bolus was started.  COVID test was obtained.  Immediate transport to women's was initiated.  She was stable throughout my care.  Normal vitals.  No evidence of fetal distress.  X-rays unremarkable.  Patient transferred to MAU given abdominal trauma and concern for premature labor.  This chart was dictated using voice recognition software.  Despite best efforts to proofread,  errors can occur which can change the documentation meaning.   Final Clinical Impression(s) / ED Diagnoses Final diagnoses:  Abdominal pain during pregnancy in third trimester    Rx / DC Orders ED Discharge Orders    None       Virgina Norfolk, DO 11/02/20 2100

## 2020-11-02 NOTE — ED Notes (Signed)
Approx. 2014 the Pt. Began to complain that she felt pressure in her abd. And she reports to RN that she is having pain in the lower abd.  RN looked at the Pt. Lower abd. And checked to see if the Pt. Had any bleeding.  Pt. Panties were wet this time.  RN called for help at this time.  Pt. Was dry before with no water noted.

## 2020-11-02 NOTE — ED Notes (Signed)
Pt. Left with [facility] and still having contractions at time of discharge.  No distress noted.

## 2020-11-02 NOTE — H&P (Signed)
History     CSN: [id number]  Arrival date and time: 11/02/20 1905   Event Date/Time   First Provider Initiated Contact with Patient 11/02/20 2201      Chief Complaint  Patient presents with  . Fall   HPI Hannah Rose is a 20 y.o. G2P1001 at [redacted]w[redacted]d who presents from [facility] after a fall. She states she missed a step on her porch and fell down 4 steps. She hit her abdomen on the way down. She reports painful contractions every 3 minutes since she fell at 1900. She also reports a gush of clear fluid while she was at [facility]. She denies any bleeding. She reports normal fetal movement.   OB History    Gravida  2   Para  1   Term  1   Preterm      AB      Living  1     SAB      IAB      Ectopic      Multiple      Live Births  1           Past Medical History:  Diagnosis Date  . Medical history non-contributory     Past Surgical History:  Procedure Laterality Date  . DILATION AND CURETTAGE OF UTERUS    . FINGER SURGERY      History reviewed. No pertinent family history.  Social History   Tobacco Use  . Smoking status: Never Smoker  . Smokeless tobacco: Never Used  Vaping Use  . Vaping Use: Never used  Substance Use Topics  . Alcohol use: No  . Drug use: No    Allergies: No Known Allergies  Medications Prior to Admission  Medication Sig Dispense Refill Last Dose  . acetaminophen (TYLENOL) 500 MG tablet Take 500 mg by mouth every 6 (six) hours as needed for moderate pain.   11/02/2020 at Unknown time  . famotidine (PEPCID) 40 MG tablet Take 1 tablet (40 mg total) by mouth 2 (two) times daily. 14 tablet 0 11/02/2020 at Unknown time  . Doxylamine-Pyridoxine 10-10 MG TBEC Take 1 tablet by mouth 2 (two) times daily as needed. 60 tablet 0   . metoCLOPramide (REGLAN) 10 MG tablet Take 1 tablet (10 mg total) by mouth every 6 (six) hours as needed for nausea or vomiting (nausea/headache). 12 tablet 0     Review of Systems  Constitutional: Negative.   Negative for fatigue and fever.  HENT: Negative.   Respiratory: Negative.  Negative for shortness of breath.   Cardiovascular: Negative.  Negative for chest pain.  Gastrointestinal: Positive for abdominal pain. Negative for constipation, diarrhea, nausea and vomiting.  Genitourinary: Positive for vaginal discharge. Negative for dysuria and vaginal bleeding.  Neurological: Negative.  Negative for dizziness and headaches.   Physical Exam   Blood pressure 121/69, pulse 63, temperature 98 F (36.7 C), resp. rate 18, height 5\' 3"  (1.6 m), weight 106.6 kg, last menstrual period 12/27/2019, SpO2 99 %, unknown if currently breastfeeding.  Physical Exam Vitals and nursing note reviewed.  Constitutional:      General: She is not in acute distress.    Appearance: She is well-developed.  HENT:     Head: Normocephalic.  Eyes:     Pupils: Pupils are equal, round, and reactive to light.  Cardiovascular:     Rate and Rhythm: Normal rate and regular rhythm.     Heart sounds: Normal heart sounds.  Pulmonary:     Effort:  Pulmonary effort is normal. No respiratory distress.     Breath sounds: Normal breath sounds.  Abdominal:     General: Bowel sounds are normal. There is no distension.     Palpations: Abdomen is soft.     Tenderness: There is no abdominal tenderness.  Skin:    General: Skin is warm and dry.  Neurological:     Mental Status: She is alert and oriented to person, place, and time.  Psychiatric:        Behavior: Behavior normal.        Thought Content: Thought content normal.        Judgment: Judgment normal.    Fetal Tracing:  Baseline: 140 Variability: moderate Accels: 10x10 Decels: none  Toco: 3-5  Dilation: 2 Effacement (%): 70 Station: -3 Presentation: Vertex Exam by:: J EMLY CNM   MAU Course  Procedures Results for orders placed or performed during the hospital encounter of 11/02/20 (from the past 24 hour(s))  Resp Panel by RT-PCR (Flu A&B, Covid)  Nasopharyngeal Swab     Status: None   Collection Time: 11/02/20  8:03 PM   Specimen: Nasopharyngeal Swab; Nasopharyngeal(NP) swabs in vial transport medium  Result Value Ref Range   SARS Coronavirus 2 by RT PCR NEGATIVE NEGATIVE   Influenza A by PCR NEGATIVE NEGATIVE   Influenza B by PCR NEGATIVE NEGATIVE  CBC     Status: Abnormal   Collection Time: 11/02/20  9:45 PM  Result Value Ref Range   WBC 11.3 (H) 4.0 - 10.5 K/uL   RBC 4.09 3.87 - 5.11 MIL/uL   Hemoglobin 11.3 (L) 12.0 - 15.0 g/dL   HCT 80.3 (L) 21.2 - 24.8 %   MCV 84.1 80.0 - 100.0 fL   MCH 27.6 26.0 - 34.0 pg   MCHC 32.8 30.0 - 36.0 g/dL   RDW 25.0 03.7 - 04.8 %   Platelets 250 150 - 400 K/uL   nRBC 0.0 0.0 - 0.2 %  Sample to Blood Bank     Status: None   Collection Time: 11/02/20  9:45 PM  Result Value Ref Range   Blood Bank Specimen SAMPLE AVAILABLE FOR TESTING    Sample Expiration      11/03/2020,2359 Performed at [facility], [address], [place], [place] [address]    MDM UA CBC, CMP, Protein/creat ratio Amnisure Wet prep and gc/chlamydia  Consulted with Dr. Charlotta Newton- recommends admission to [facility] for 23 hour observation after fall with direct abdominal trauma  Assessment and Plan  -Fall in pregnancy -[redacted] weeks gestation  -Admit to [facility] -Care turned over to MD  Rolm Bookbinder CNM 11/02/2020, 10:01 PM

## 2020-11-02 NOTE — MAU Note (Signed)
Pt transferred via [facility] from [facility] s/p fall down stairs outside today around 1840. Hit right side abdomen, arm & leg. Denies VB, reported underwear felt damp at [facility] ED. Reports some pain/cntrx which are irregular  +FM.  Rating pain 8/10. Took 1  Extra-strength Tylenol at home prior to going to [facility] ED.

## 2020-11-03 DIAGNOSIS — Z3A33 33 weeks gestation of pregnancy: Secondary | ICD-10-CM | POA: Diagnosis not present

## 2020-11-03 DIAGNOSIS — O4703 False labor before 37 completed weeks of gestation, third trimester: Secondary | ICD-10-CM | POA: Diagnosis not present

## 2020-11-03 LAB — GC/CHLAMYDIA PROBE AMP (~~LOC~~) NOT AT ARMC
Chlamydia: NEGATIVE
Comment: NEGATIVE
Comment: NORMAL
Neisseria Gonorrhea: NEGATIVE

## 2020-11-03 MED ORDER — FAMOTIDINE 20 MG PO TABS
20.0000 mg | ORAL_TABLET | Freq: Two times a day (BID) | ORAL | Status: DC
  Administered 2020-11-03 (×2): 20 mg via ORAL
  Filled 2020-11-03 (×2): qty 1

## 2020-11-03 NOTE — Discharge Summary (Signed)
Antenatal Physician Discharge Summary  Patient ID: Rashea Hoskie MRN: [id number] DOB/AGE: 11/01/2000 20 y.o.  Admit date: 11/02/2020 Discharge date: 11/03/2020  Admission Diagnoses: Preterm contractions, concern about preterm labor, [redacted] weeks gestation, status post fall at home  Discharge Diagnoses: No preterm labor  Prenatal Procedures: NST  Hospital Course:  Hannah Rose is a 20 y.o. G2P1001 with IUP at [redacted]w[redacted]d admitted for observations, preterm contractions after fall at home.  She was admitted with contractions, noted to have a cervical exam of 2/70/-3.  No leaking of fluid and no bleeding.  She received betamethasone x 2 doses.  Contractions stopped, no tocolysis needed. Category 1 FHR tracing.  She had no signs/symptoms of progressing preterm labor or other maternal-fetal concerns.  Her cervical exam was unchanged from admission.  She was deemed stable for discharge to home with outpatient follow up.  Discharge Exam: Temp:  [98 F (36.7 C)-98.6 F (37 C)] 98.4 F (36.9 C) (05/03 1527) Pulse Rate:  [61-108] 62 (05/03 1527) Resp:  [18-20] 18 (05/03 1527) BP: (101-147)/(59-95) 125/66 (05/03 1527) SpO2:  [92 %-100 %] 99 % (05/03 1527) Weight:  [106.6 kg] 106.6 kg (05/02 1915) Physical Examination: General appearance - alert, well appearing, and in no distress Mental status - normal mood, behavior, speech, dress, motor activity, and thought processes Chest - clear to auscultation bilaterally Heart - normal rate and regular rhythm Abdomen - gravid, soft and non-tender Extremities - no edema, no calf tenderness CERVIX: Dilation: 1.5 Effacement (%): 70 Cervical Position: Posterior Station: -3 Presentation: Vertex Exam by:: m hoppenbauer rn  Fetal monitoring: FHR: 135 bpm, Variability: moderate, Accelerations: Present, Decelerations: Absent  Uterine activity: No contractions  Significant Diagnostic Studies:  Results for orders placed or performed during the hospital  encounter of 11/02/20 (from the past 168 hour(s))  Resp Panel by RT-PCR (Flu A&B, Covid) Nasopharyngeal Swab   Collection Time: 11/02/20  8:03 PM   Specimen: Nasopharyngeal Swab; Nasopharyngeal(NP) swabs in vial transport medium  Result Value Ref Range   SARS Coronavirus 2 by RT PCR NEGATIVE NEGATIVE   Influenza A by PCR NEGATIVE NEGATIVE   Influenza B by PCR NEGATIVE NEGATIVE  GC/Chlamydia probe amp ([place])not at [facility]   Collection Time: 11/02/20  9:26 PM  Result Value Ref Range   Neisseria Gonorrhea Negative    Chlamydia Negative    Comment Normal Reference Ranger Chlamydia - Negative    Comment      Normal Reference Range Neisseria Gonorrhea - Negative  CBC   Collection Time: 11/02/20  9:45 PM  Result Value Ref Range   WBC 11.3 (H) 4.0 - 10.5 K/uL   RBC 4.09 3.87 - 5.11 MIL/uL   Hemoglobin 11.3 (L) 12.0 - 15.0 g/dL   HCT 24.4 (L) 01.0 - 27.2 %   MCV 84.1 80.0 - 100.0 fL   MCH 27.6 26.0 - 34.0 pg   MCHC 32.8 30.0 - 36.0 g/dL   RDW 53.6 64.4 - 03.4 %   Platelets 250 150 - 400 K/uL   nRBC 0.0 0.0 - 0.2 %  Comprehensive metabolic panel   Collection Time: 11/02/20  9:45 PM  Result Value Ref Range   Sodium 140 135 - 145 mmol/L   Potassium 3.1 (L) 3.5 - 5.1 mmol/L   Chloride 108 98 - 111 mmol/L   CO2 22 22 - 32 mmol/L   Glucose, Bld 74 70 - 99 mg/dL   BUN <5 (L) 6 - 20 mg/dL   Creatinine, Ser 7.42 0.44 - 1.00 mg/dL  Calcium 8.6 (L) 8.9 - 10.3 mg/dL   Total Protein 5.8 (L) 6.5 - 8.1 g/dL   Albumin 2.4 (L) 3.5 - 5.0 g/dL   AST 19 15 - 41 U/L   ALT 13 0 - 44 U/L   Alkaline Phosphatase 146 (H) 38 - 126 U/L   Total Bilirubin 0.7 0.3 - 1.2 mg/dL   GFR, Estimated >75 >64 mL/min   Anion gap 10 5 - 15  Sample to Blood Bank   Collection Time: 11/02/20  9:45 PM  Result Value Ref Range   Blood Bank Specimen SAMPLE AVAILABLE FOR TESTING    Sample Expiration      11/03/2020,2359 Performed at [facility], [address] [place], [place] [address]   Type and screen  [facility]   Collection Time: 11/02/20  9:45 PM  Result Value Ref Range   ABO/RH(D) O POS    Antibody Screen NEG    Sample Expiration      11/05/2020,2359 Performed at [facility], [address], [place], [place] [address]   Urinalysis, Routine w reflex microscopic Cervix   Collection Time: 11/02/20 10:20 PM  Result Value Ref Range   Color, Urine AMBER (A) YELLOW   APPearance HAZY (A) CLEAR   Specific Gravity, Urine 1.029 1.005 - 1.030   pH 6.0 5.0 - 8.0   Glucose, UA NEGATIVE NEGATIVE mg/dL   Hgb urine dipstick NEGATIVE NEGATIVE   Bilirubin Urine NEGATIVE NEGATIVE   Ketones, ur 80 (A) NEGATIVE mg/dL   Protein, ur 30 (A) NEGATIVE mg/dL   Nitrite NEGATIVE NEGATIVE   Leukocytes,Ua TRACE (A) NEGATIVE   RBC / HPF 0-5 0 - 5 RBC/hpf   WBC, UA 11-20 0 - 5 WBC/hpf   Bacteria, UA FEW (A) NONE SEEN   Squamous Epithelial / LPF 6-10 0 - 5   Mucus PRESENT   Protein / creatinine ratio, urine   Collection Time: 11/02/20 10:20 PM  Result Value Ref Range   Creatinine, Urine 398.66 mg/dL   Total Protein, Urine 32 mg/dL   Protein Creatinine Ratio 0.08 0.00 - 0.15 mg/mg[Cre]  Wet prep, genital   Collection Time: 11/02/20 11:15 PM   Specimen: Cervix  Result Value Ref Range   Yeast Wet Prep HPF POC NONE SEEN NONE SEEN   Trich, Wet Prep NONE SEEN NONE SEEN   Clue Cells Wet Prep HPF POC NONE SEEN NONE SEEN   WBC, Wet Prep HPF POC MANY (A) NONE SEEN   Sperm NONE SEEN   Amnisure rupture of membrane (rom)not at [facility]   Collection Time: 11/02/20 11:15 PM  Result Value Ref Range   Amnisure ROM NEGATIVE    DG Wrist Complete Left  Result Date: 11/02/2020 CLINICAL DATA:  Pain EXAM: LEFT WRIST - COMPLETE 3+ VIEW COMPARISON:  None. FINDINGS: There is no evidence of fracture or dislocation. There is no evidence of arthropathy or other focal bone abnormality. Soft tissues are unremarkable. IMPRESSION: Negative. Electronically Signed   By: Jasmine Pang M.D.   On: 11/02/2020  20:40   DG Knee 1-2 Views Left  Result Date: 11/02/2020 CLINICAL DATA:  Larey Seat with knee pain. EXAM: LEFT KNEE - 1-2 VIEW COMPARISON:  None. FINDINGS: Only lateral view was ordered because the patient was pregnant. Normal frontal projection of the knee. No joint space narrowing. No traumatic finding. IMPRESSION: Normal one view frontal exam. Electronically Signed   By: Paulina Fusi M.D.   On: 11/02/2020 20:55    No future  appointments.  Discharge Condition: Stable  Discharge disposition: 01-Home or Self Care        Allergies as of 11/03/2020   No Known Allergies     Medication List    TAKE these medications   acetaminophen 500 MG tablet Commonly known as: TYLENOL Take 500 mg by mouth every 6 (six) hours as needed for moderate pain.   Doxylamine-Pyridoxine 10-10 MG Tbec Take 1 tablet by mouth 2 (two) times daily as needed.   famotidine 40 MG tablet Commonly known as: Pepcid Take 1 tablet (40 mg total) by mouth 2 (two) times daily.   metoCLOPramide 10 MG tablet Commonly known as: Reglan Take 1 tablet (10 mg total) by mouth every 6 (six) hours as needed for nausea or vomiting (nausea/headache).        Total discharge time: 15 minutes   Signed: Jaynie Collins M.D. 11/03/2020, 5:30 PM

## 2020-11-03 NOTE — Progress Notes (Signed)
RN given verbal order from Dr. Charlotta Newton  to remove fetal monitoring and toco for now. RN instructed to reapply monitors if pt reports pain with contractions.

## 2020-11-03 NOTE — Discharge Instructions (Signed)
Preventing Preterm Birth Preterm birth is when a baby is delivered between 20 weeks and 37 weeks of pregnancy. A full-term pregnancy lasts for at least 37 weeks. Preterm birth can be dangerous for your baby because the last few weeks of pregnancy are an important time for your baby to grow and reach a normal birth weight. How can preterm birth affect my baby? Complications of preterm birth may include:  Breathing problems.  Brain damage that affects movement and coordination (cerebral palsy).  Trouble with feeding.  Problems with vision or hearing.  Infections or inflammation of the digestive tract (colitis).  Developmental delays and learning disabilities.  Low birth weight or very low birth weight.  Higher risk for diabetes, heart disease, and high blood pressure later in life. What can increase my risk of having a preterm birth? The exact cause of preterm birth is unknown. The following factors make you more likely to have a preterm birth:  Being diagnosed with placenta previa. This is a condition in which the placenta covers the lowest part of your uterus (cervix), which opens into the vagina.  Certain conditions of your current and past pregnancies, such as: ? Having had a preterm birth before. ? Being pregnant with multiples. ? Waiting less than 6 months between giving birth and becoming pregnant again. ? Certain abnormalities in your unborn baby. ? Vaginal bleeding during pregnancy. ? Becoming pregnant through in vitro fertilization (IVF).  Being overweight or underweight.  Medical history of: ? STIs (sexually transmitted infections) or other infections of the urinary tract and the vagina. ? Long-term (chronic) illnesses, such as blood clotting problems, diabetes, or high blood pressure. ? Short cervix.  Lifestyle and environmental factors, such as: ? Using tobacco products or drugs. ? Drinking alcohol. ? Having stress and no social support. ? Violence in the home  (domestic violence). ? Being exposed to certain chemicals or pollutants in the environment. What actions can I take to prevent preterm birth? Medical care The most important thing you can do to lower your risk for preterm birth is to get routine medical care during pregnancy (prenatal care). Keep all follow-up visits as told by your health care provider. This is important.  If you have a high risk of preterm birth:  You may be referred to a health care provider who specializes in managing high-risk pregnancies (perinatologist).  You may be given medicine to help prevent preterm birth. Lifestyle Certain lifestyle changes can also lower your risk of preterm birth:  Wait at least 6 months after a pregnancy to become pregnant again.  Get to a healthy weight before getting pregnant. If you are overweight, work with your health care provider to safely lose weight.  Do not use any products that contain nicotine or tobacco, such as cigarettes, e-cigarettes, and chewing tobacco. If you need help quitting, ask your health care provider.  Do not drink alcohol.  Do not use drugs.  Eat a healthy diet.  Manage other medical problems, such as diabetes or high blood pressure.   Where to find support For more support, consider:  Talking with your health care provider.  Talking with a therapist or substance abuse counselor, if you need help quitting.  Working with a dietitian or a personal trainer to maintain a healthy weight.  Joining a support group. Where to find more information Learn more about preventing preterm birth from:  Centers for Disease Control and Prevention: cdc.gov  March of Dimes: marchofdimes.org  American Pregnancy Association: americanpregnancy.org Contact a   health care provider if: You have any of the following signs or symptoms of preterm labor before 37 weeks:  A change or increase in vaginal discharge.  Fluid leaking from your vagina.  Pressure or cramps in  your lower abdomen.  A backache that does not go away or gets worse.  Regular tightening (contractions) in your lower abdomen. Get help right away if:  You are having regular painful contractions every 5 minutes or less.  Your water breaks. Summary  Preterm birth means having your baby during weeks 20-37 of pregnancy.  Preterm birth may put your baby at risk for physical and mental problems.  The exact cause of preterm birth is unknown. However, being diagnosed with placenta previa or having vaginal bleeding or an STI (sexually transmitted infection) increases your risk for preterm birth.  Getting good prenatal care can help prevent preterm birth. Keep all follow-up visits as told by your health care provider. This is important.  Contact a health care provider if you have signs or symptoms of preterm labor. This information is not intended to replace advice given to you by your health care provider. Make sure you discuss any questions you have with your health care provider. Document Revised: 05/27/2019 Document Reviewed: 05/27/2019 Elsevier Patient Education  2021 Elsevier Inc.  

## 2020-11-03 NOTE — Progress Notes (Signed)
FACULTY PRACTICE ANTEPARTUM(COMPREHENSIVE) NOTE  Hannah Rose is a 20 y.o. G2P1001 with Estimated Date of Delivery: 12/22/20   By  LMP [redacted]w[redacted]d  who is admitted for preterm labor and recent fall.    Fetal presentation is cephalic. Length of Stay:  0  Days  Date of admission:11/02/2020  Subjective: Pt reports that she is still having some back and abdominal pain overnight.  Usually every 4-65min, not as strong as when she arrived. Patient reports the fetal movement as active. Patient reports  vaginal bleeding as none. Patient describes fluid per vagina as occasional clear discharge- amnisure negative  Vitals:  Blood pressure 118/72, pulse 67, temperature 98.2 F (36.8 C), temperature source Oral, resp. rate 18, height 5\' 3"  (1.6 m), weight 106.6 kg, last menstrual period 12/27/2019, SpO2 100 %, unknown if currently breastfeeding. Vitals:   11/02/20 2225 11/02/20 2230 11/02/20 2251 11/02/20 2321  BP:   118/72   Pulse:      Resp:    18  Temp:    98.2 F (36.8 C)  TempSrc:    Oral  SpO2: 99% 95%  100%  Weight:      Height:       Physical Examination:  General appearance - alert, well appearing, and in no distress Mental status - normal mood, behavior, speech, dress, motor activity, and thought processes Chest - clear to auscultation bilaterally Heart - normal rate and regular rhythm Abdomen - gravid, soft and non-tender Extremities - no edema, no calf tenderness GU: deferred Skin - warm and dry   Fetal Monitoring:  Baseline: 135 bpm, Variability: moderate, Accelerations: +15x15 accels and Decelerations: Absent   reactive  Toco: some irritability, no regular contractions appreciated  Labs:  Results for orders placed or performed during the hospital encounter of 11/02/20 (from the past 24 hour(s))  Resp Panel by RT-PCR (Flu A&B, Covid) Nasopharyngeal Swab   Collection Time: 11/02/20  8:03 PM   Specimen: Nasopharyngeal Swab; Nasopharyngeal(NP) swabs in vial transport medium   Result Value Ref Range   SARS Coronavirus 2 by RT PCR NEGATIVE NEGATIVE   Influenza A by PCR NEGATIVE NEGATIVE   Influenza B by PCR NEGATIVE NEGATIVE  CBC   Collection Time: 11/02/20  9:45 PM  Result Value Ref Range   WBC 11.3 (H) 4.0 - 10.5 K/uL   RBC 4.09 3.87 - 5.11 MIL/uL   Hemoglobin 11.3 (L) 12.0 - 15.0 g/dL   HCT 01/02/21 (L) 56.3 - 87.5 %   MCV 84.1 80.0 - 100.0 fL   MCH 27.6 26.0 - 34.0 pg   MCHC 32.8 30.0 - 36.0 g/dL   RDW 64.3 32.9 - 51.8 %   Platelets 250 150 - 400 K/uL   nRBC 0.0 0.0 - 0.2 %  Comprehensive metabolic panel   Collection Time: 11/02/20  9:45 PM  Result Value Ref Range   Sodium 140 135 - 145 mmol/L   Potassium 3.1 (L) 3.5 - 5.1 mmol/L   Chloride 108 98 - 111 mmol/L   CO2 22 22 - 32 mmol/L   Glucose, Bld 74 70 - 99 mg/dL   BUN <5 (L) 6 - 20 mg/dL   Creatinine, Ser 01/02/21 0.44 - 1.00 mg/dL   Calcium 8.6 (L) 8.9 - 10.3 mg/dL   Total Protein 5.8 (L) 6.5 - 8.1 g/dL   Albumin 2.4 (L) 3.5 - 5.0 g/dL   AST 19 15 - 41 U/L   ALT 13 0 - 44 U/L   Alkaline Phosphatase 146 (H) 38 - 126  U/L   Total Bilirubin 0.7 0.3 - 1.2 mg/dL   GFR, Estimated >58 >52 mL/min   Anion gap 10 5 - 15  Sample to Blood Bank   Collection Time: 11/02/20  9:45 PM  Result Value Ref Range   Blood Bank Specimen SAMPLE AVAILABLE FOR TESTING    Sample Expiration      11/03/2020,2359 Performed at [facility], [address] [place], [place] [address]   Type and screen [facility]   Collection Time: 11/02/20  9:45 PM  Result Value Ref Range   ABO/RH(D) O POS    Antibody Screen NEG    Sample Expiration      11/05/2020,2359 Performed at [facility], [address], [place], [place] [address]   Urinalysis, Routine w reflex microscopic Cervix   Collection Time: 11/02/20 10:20 PM  Result Value Ref Range   Color, Urine AMBER (A) YELLOW   APPearance HAZY (A) CLEAR   Specific Gravity, Urine 1.029 1.005 - 1.030   pH 6.0 5.0 - 8.0   Glucose, UA NEGATIVE  NEGATIVE mg/dL   Hgb urine dipstick NEGATIVE NEGATIVE   Bilirubin Urine NEGATIVE NEGATIVE   Ketones, ur 80 (A) NEGATIVE mg/dL   Protein, ur 30 (A) NEGATIVE mg/dL   Nitrite NEGATIVE NEGATIVE   Leukocytes,Ua TRACE (A) NEGATIVE   RBC / HPF 0-5 0 - 5 RBC/hpf   WBC, UA 11-20 0 - 5 WBC/hpf   Bacteria, UA FEW (A) NONE SEEN   Squamous Epithelial / LPF 6-10 0 - 5   Mucus PRESENT   Protein / creatinine ratio, urine   Collection Time: 11/02/20 10:20 PM  Result Value Ref Range   Creatinine, Urine 398.66 mg/dL   Total Protein, Urine 32 mg/dL   Protein Creatinine Ratio 0.08 0.00 - 0.15 mg/mg[Cre]  Wet prep, genital   Collection Time: 11/02/20 11:15 PM   Specimen: Cervix  Result Value Ref Range   Yeast Wet Prep HPF POC NONE SEEN NONE SEEN   Trich, Wet Prep NONE SEEN NONE SEEN   Clue Cells Wet Prep HPF POC NONE SEEN NONE SEEN   WBC, Wet Prep HPF POC MANY (A) NONE SEEN   Sperm NONE SEEN   Amnisure rupture of membrane (rom)not at [facility]   Collection Time: 11/02/20 11:15 PM  Result Value Ref Range   Amnisure ROM NEGATIVE       Medications:  Scheduled . betamethasone acetate-betamethasone sodium phosphate  12 mg Intramuscular Q24H  . docusate sodium  100 mg Oral Daily  . famotidine  20 mg Oral BID  . NIFEdipine  10 mg Oral Q6H  . prenatal multivitamin  1 tablet Oral Q1200   I have reviewed the patient's current medications.  ASSESSMENT: G2P1001 [redacted]w[redacted]d Estimated Date of Delivery: 12/22/20  Patient Active Problem List   Diagnosis Date Noted  . Preterm labor 11/02/2020    PLAN: 1) Preterm labor -no regular contractions appreciated on toco -currently on tocolysis with procardia -BMZ given for fetal lung maturity  2) FWB- Cat. I -transition to q shift monitoring if pt reports clinic improvement of contractions later today  3) Maternal care -PNV daily -Pepcid twice daily  DISPO: Continue in-house monitoring until pt completes course of betamethasone.  Myna Hidalgo, DO Attending  Obstetrician & Gynecologist, [facility] for [employer], [facility]

## 2021-01-06 IMAGING — CR DG FINGER THUMB 2+V*L*
3 series · 3 of 3 positions shown · non-contrast
Comparison: No recent prior.

CLINICAL DATA: Left thumb injury.

EXAM:
LEFT THUMB 2+V

[x finger pa left]
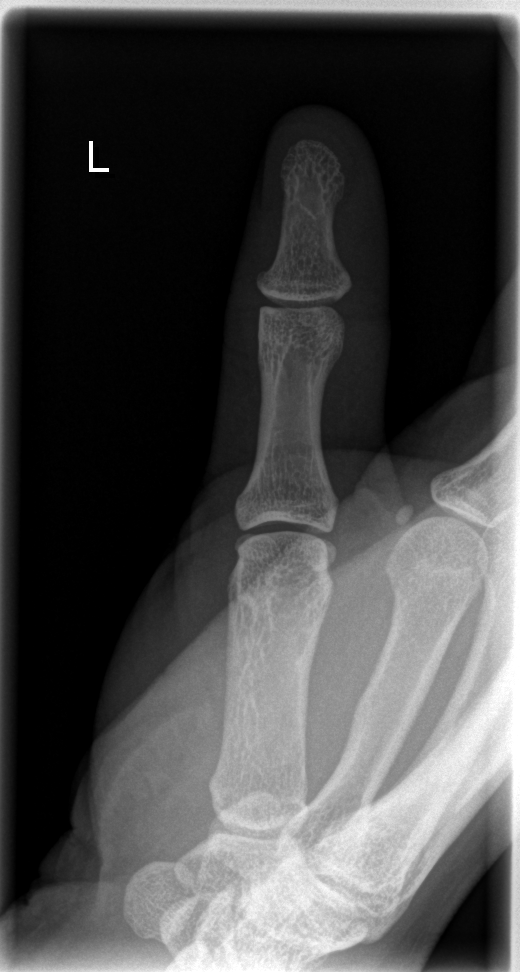

[x finger obl. left]
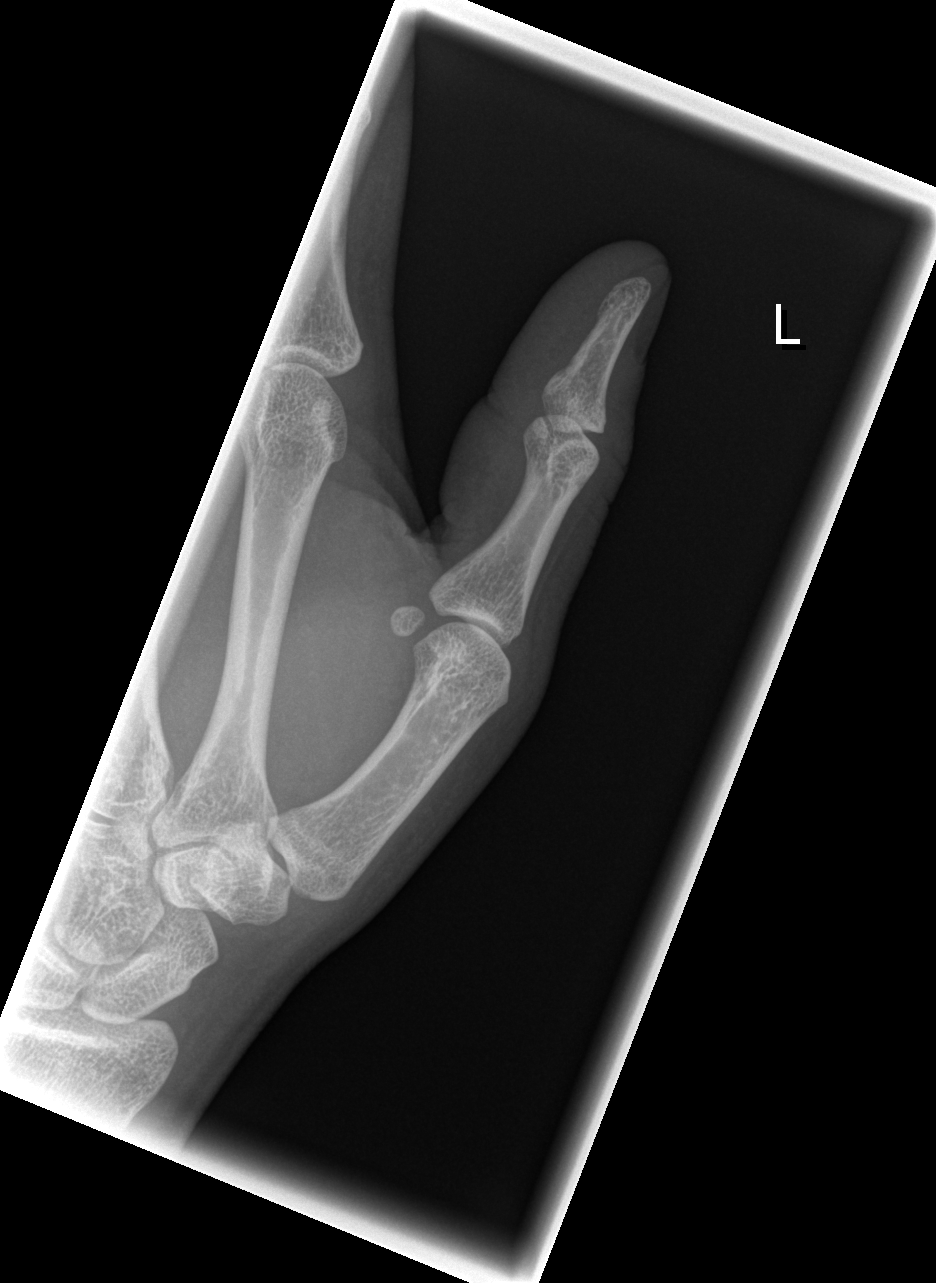

[x finger lateral left]
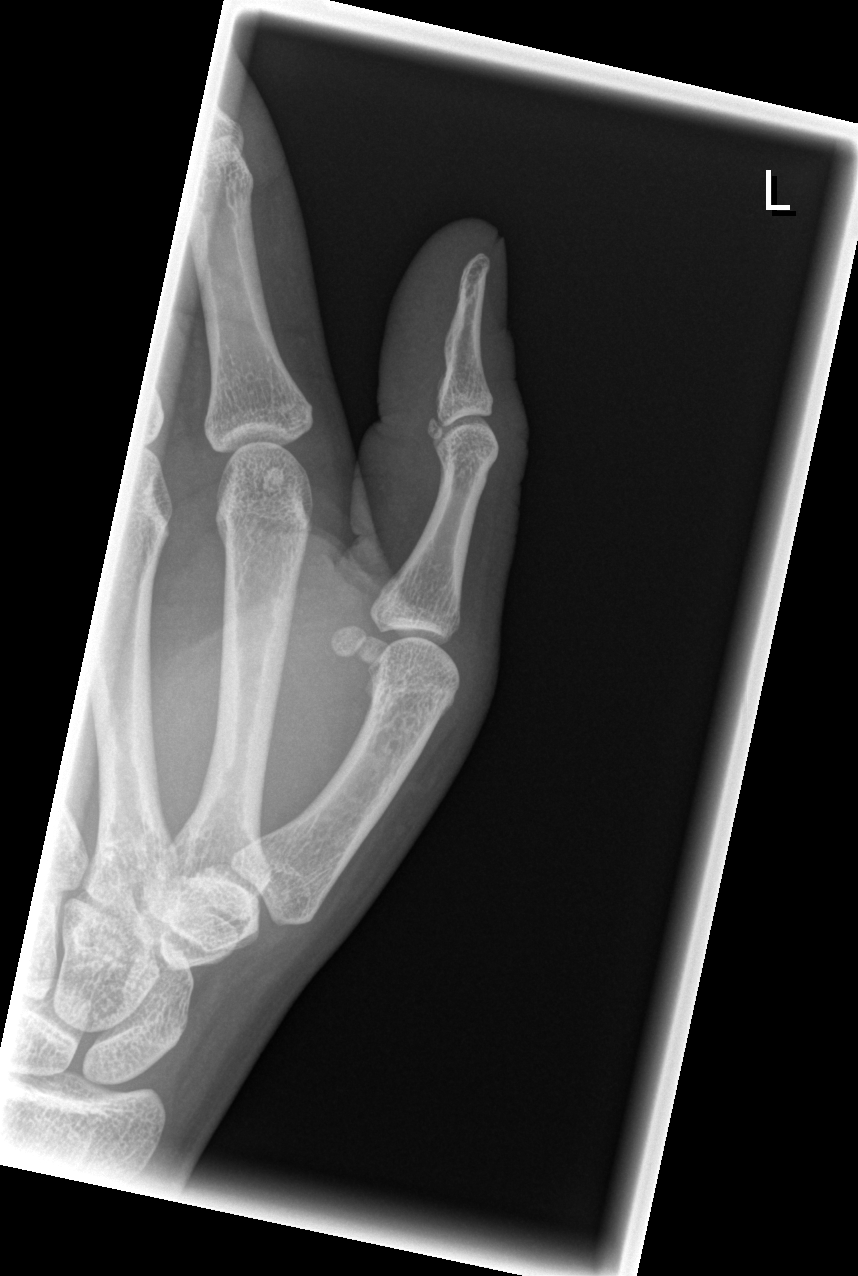

[3 of 3 positions shown; findings below may reference images not displayed]

FINDINGS: No acute bony or joint abnormality identified. No evidence of
fracture dislocation. No radiopaque foreign body.
IMPRESSION: No acute bony abnormality.  No radiopaque foreign body.

## 2021-10-09 IMAGING — DX DG WRIST COMPLETE 3+V*L*
3 series · 3 of 3 positions shown · non-contrast
Comparison: None.

CLINICAL DATA: Pain

EXAM:
LEFT WRIST - COMPLETE 3+ VIEW

[wrist ap]
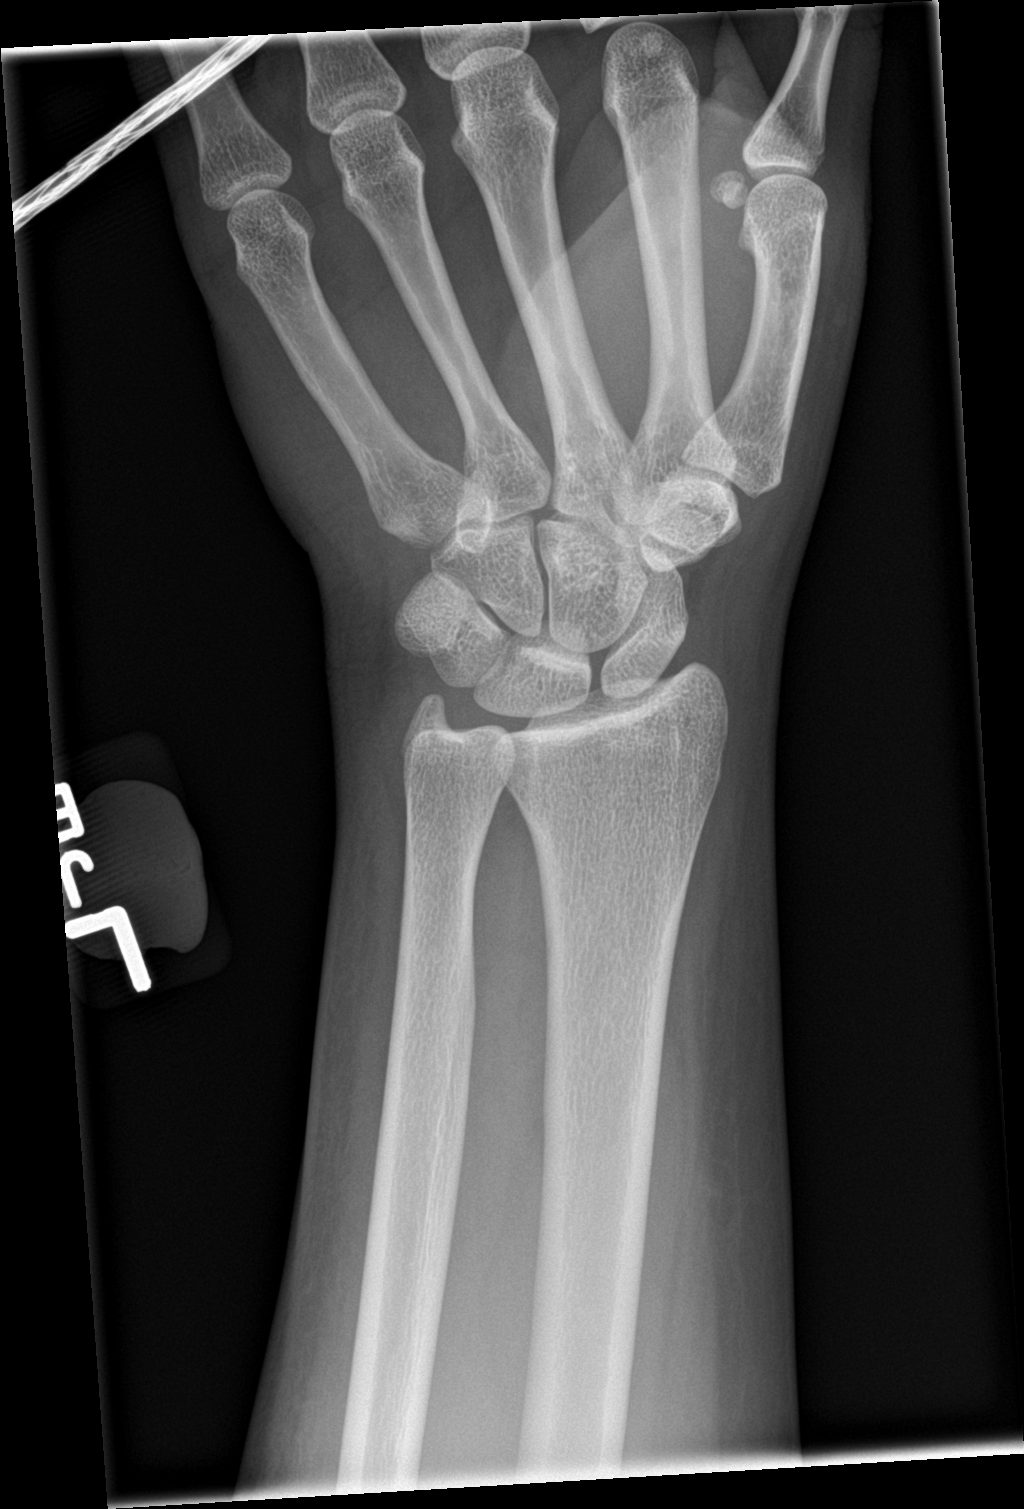

[wrist obl]
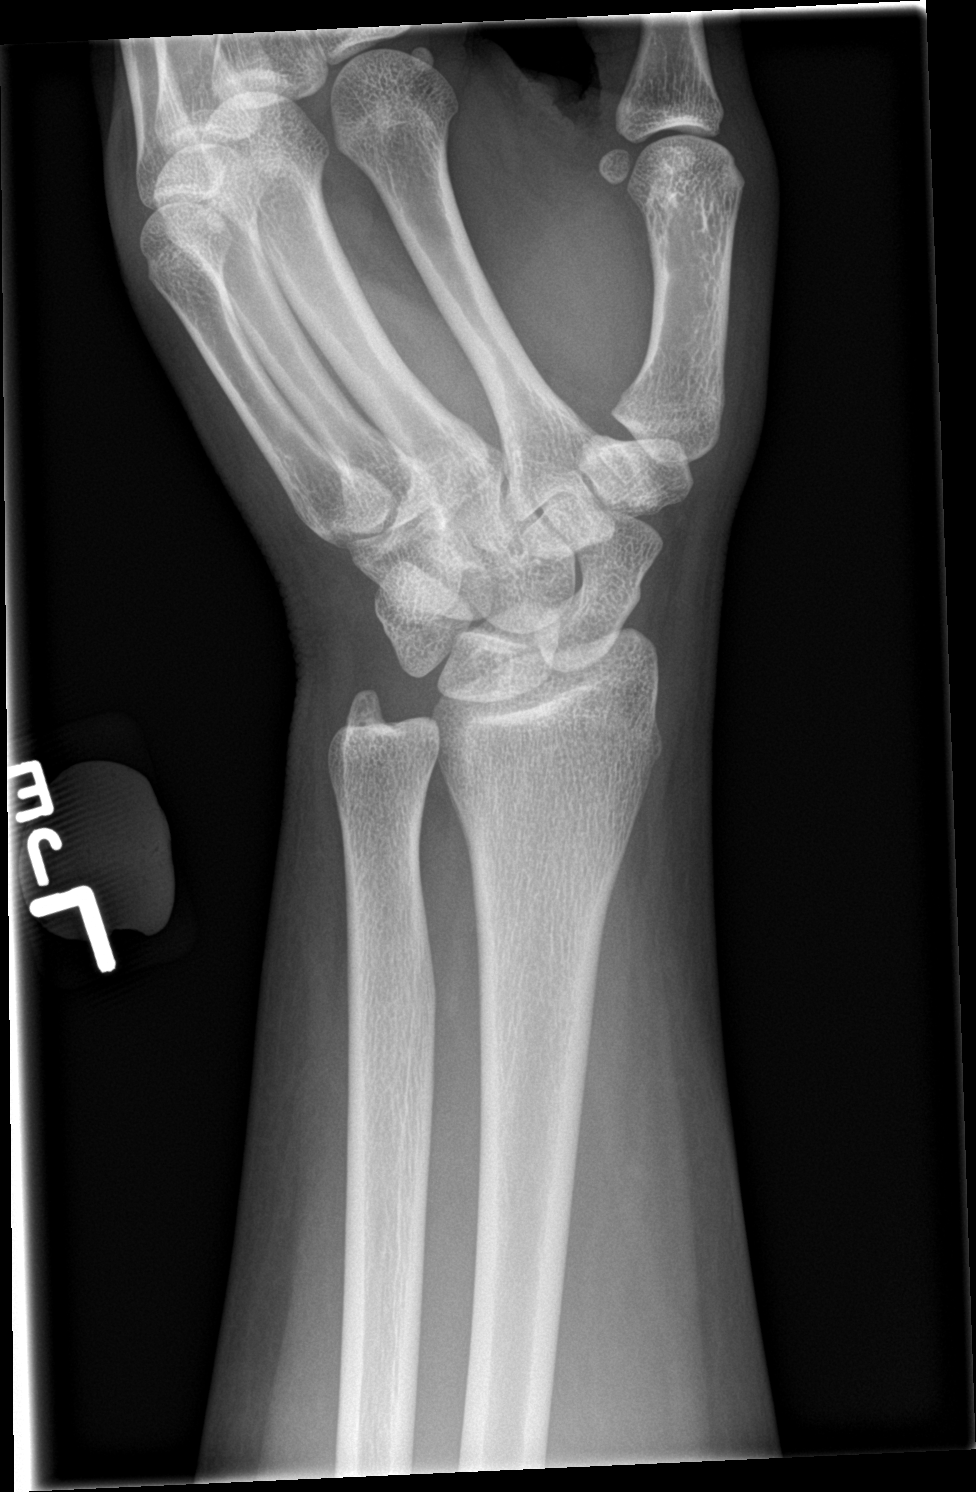

[wrist tunnel]
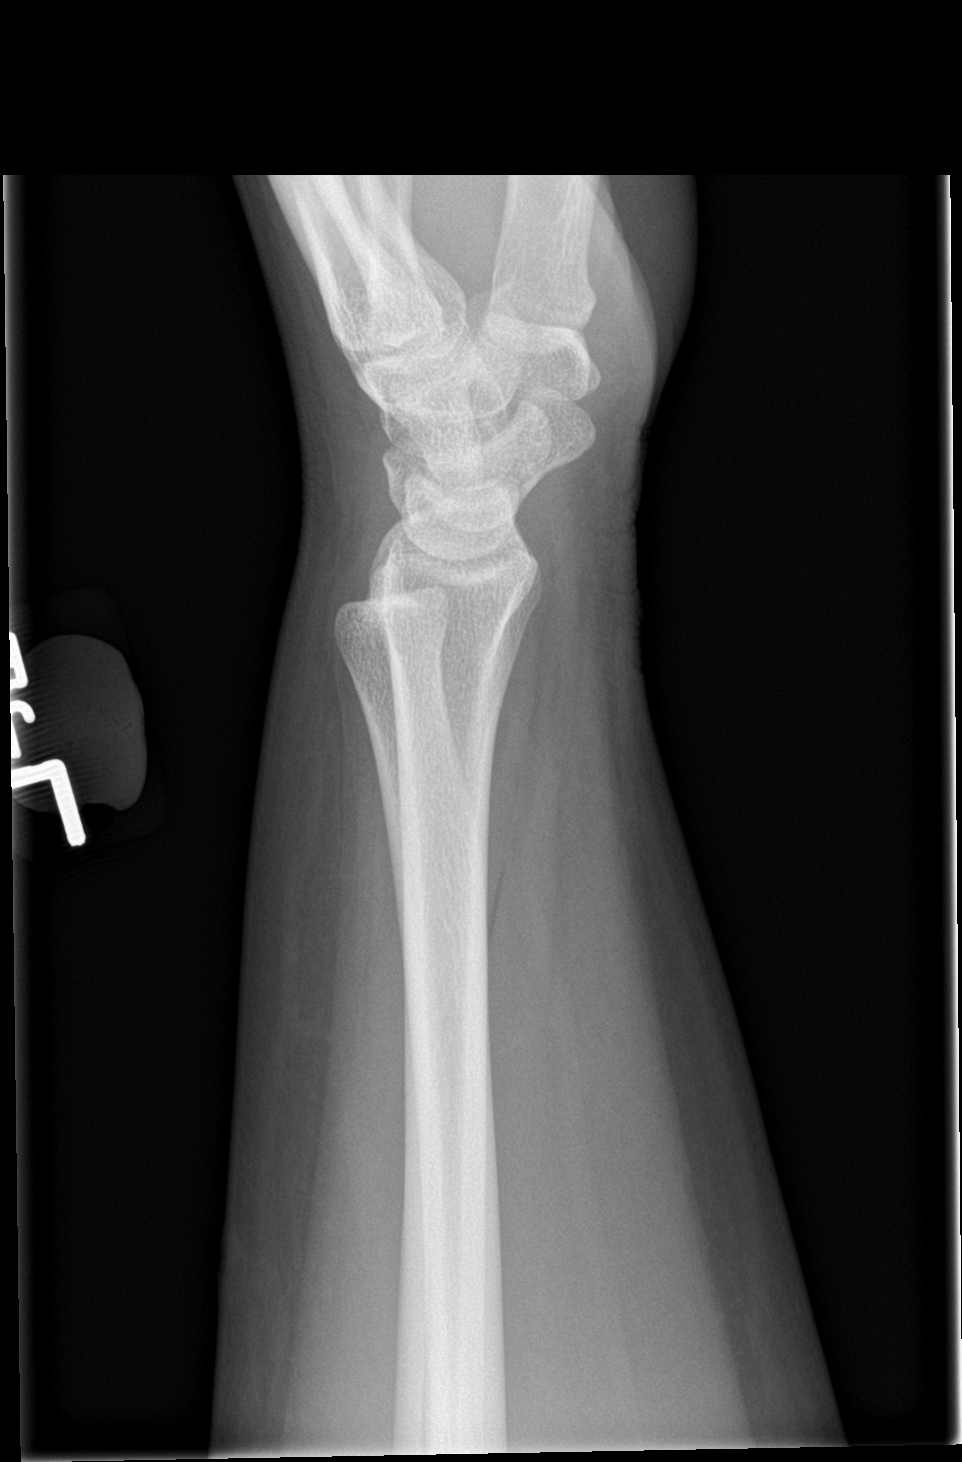

[3 of 3 positions shown; findings below may reference images not displayed]

FINDINGS: There is no evidence of fracture or dislocation. There is no
evidence of arthropathy or other focal bone abnormality. Soft
tissues are unremarkable.
IMPRESSION: Negative.
